# Patient Record
Sex: Female | Born: 1955 | Race: Black or African American | Hispanic: No | Marital: Single | Smoking: Former smoker
Health system: Southern US, Community
[De-identification: ages and names within clinical notes are randomized; demographics above are authoritative.]

## PROBLEM LIST (undated history)

## (undated) DIAGNOSIS — I10 Essential (primary) hypertension: Secondary | ICD-10-CM

## (undated) DIAGNOSIS — I499 Cardiac arrhythmia, unspecified: Secondary | ICD-10-CM

## (undated) DIAGNOSIS — E119 Type 2 diabetes mellitus without complications: Secondary | ICD-10-CM

## (undated) DIAGNOSIS — K219 Gastro-esophageal reflux disease without esophagitis: Secondary | ICD-10-CM

## (undated) DIAGNOSIS — J3800 Paralysis of vocal cords and larynx, unspecified: Secondary | ICD-10-CM

## (undated) DIAGNOSIS — G4730 Sleep apnea, unspecified: Secondary | ICD-10-CM

## (undated) DIAGNOSIS — E039 Hypothyroidism, unspecified: Secondary | ICD-10-CM

## (undated) DIAGNOSIS — I5030 Unspecified diastolic (congestive) heart failure: Secondary | ICD-10-CM

## (undated) HISTORY — PX: CHOLECYSTECTOMY: SHX55

## (undated) HISTORY — PX: THYROIDECTOMY: SHX17

## (undated) HISTORY — PX: ABDOMINAL HYSTERECTOMY: SHX81

---

## 1997-08-23 ENCOUNTER — Emergency Department (HOSPITAL_COMMUNITY): Admission: EM | Admit: 1997-08-23 | Discharge: 1997-08-23 | Payer: Self-pay | Admitting: Emergency Medicine

## 1997-10-18 ENCOUNTER — Observation Stay (HOSPITAL_COMMUNITY): Admission: RE | Admit: 1997-10-18 | Discharge: 1997-10-19 | Payer: Self-pay | Admitting: Obstetrics and Gynecology

## 1997-10-18 ENCOUNTER — Encounter: Payer: Self-pay | Admitting: Obstetrics and Gynecology

## 1999-06-23 ENCOUNTER — Ambulatory Visit (HOSPITAL_COMMUNITY): Admission: RE | Admit: 1999-06-23 | Discharge: 1999-06-23 | Payer: Self-pay | Admitting: Obstetrics and Gynecology

## 1999-06-23 ENCOUNTER — Encounter (INDEPENDENT_AMBULATORY_CARE_PROVIDER_SITE_OTHER): Payer: Self-pay | Admitting: Specialist

## 2001-07-19 ENCOUNTER — Other Ambulatory Visit: Admission: RE | Admit: 2001-07-19 | Discharge: 2001-07-19 | Payer: Self-pay | Admitting: Obstetrics and Gynecology

## 2002-03-14 ENCOUNTER — Encounter: Payer: Self-pay | Admitting: *Deleted

## 2002-03-14 ENCOUNTER — Ambulatory Visit (HOSPITAL_COMMUNITY): Admission: RE | Admit: 2002-03-14 | Discharge: 2002-03-14 | Payer: Self-pay | Admitting: *Deleted

## 2002-03-31 ENCOUNTER — Ambulatory Visit (HOSPITAL_COMMUNITY): Admission: RE | Admit: 2002-03-31 | Discharge: 2002-03-31 | Payer: Self-pay | Admitting: Family Medicine

## 2002-03-31 ENCOUNTER — Encounter: Payer: Self-pay | Admitting: Family Medicine

## 2002-07-25 ENCOUNTER — Other Ambulatory Visit: Admission: RE | Admit: 2002-07-25 | Discharge: 2002-07-25 | Payer: Self-pay | Admitting: Obstetrics and Gynecology

## 2002-07-27 ENCOUNTER — Ambulatory Visit (HOSPITAL_COMMUNITY): Admission: RE | Admit: 2002-07-27 | Discharge: 2002-07-27 | Payer: Self-pay | Admitting: Obstetrics and Gynecology

## 2002-07-27 ENCOUNTER — Encounter: Payer: Self-pay | Admitting: Obstetrics and Gynecology

## 2002-12-19 ENCOUNTER — Inpatient Hospital Stay (HOSPITAL_COMMUNITY): Admission: RE | Admit: 2002-12-19 | Discharge: 2002-12-21 | Payer: Self-pay | Admitting: Obstetrics and Gynecology

## 2002-12-19 ENCOUNTER — Encounter (INDEPENDENT_AMBULATORY_CARE_PROVIDER_SITE_OTHER): Payer: Self-pay | Admitting: Specialist

## 2003-03-06 ENCOUNTER — Inpatient Hospital Stay (HOSPITAL_COMMUNITY): Admission: EM | Admit: 2003-03-06 | Discharge: 2003-03-07 | Payer: Self-pay | Admitting: Emergency Medicine

## 2003-05-04 ENCOUNTER — Ambulatory Visit (HOSPITAL_BASED_OUTPATIENT_CLINIC_OR_DEPARTMENT_OTHER): Admission: RE | Admit: 2003-05-04 | Discharge: 2003-05-04 | Payer: Self-pay | Admitting: Family Medicine

## 2004-07-29 ENCOUNTER — Encounter (HOSPITAL_COMMUNITY): Admission: RE | Admit: 2004-07-29 | Discharge: 2004-10-14 | Payer: Self-pay | Admitting: Family Medicine

## 2004-08-20 ENCOUNTER — Encounter (INDEPENDENT_AMBULATORY_CARE_PROVIDER_SITE_OTHER): Payer: Self-pay | Admitting: *Deleted

## 2004-08-20 ENCOUNTER — Other Ambulatory Visit: Admission: RE | Admit: 2004-08-20 | Discharge: 2004-08-20 | Payer: Self-pay | Admitting: Interventional Radiology

## 2004-08-20 ENCOUNTER — Encounter: Admission: RE | Admit: 2004-08-20 | Discharge: 2004-08-20 | Payer: Self-pay | Admitting: Family Medicine

## 2006-05-05 ENCOUNTER — Ambulatory Visit: Payer: Self-pay | Admitting: Internal Medicine

## 2006-06-02 ENCOUNTER — Ambulatory Visit: Payer: Self-pay | Admitting: Internal Medicine

## 2006-12-03 ENCOUNTER — Ambulatory Visit: Payer: Self-pay | Admitting: Cardiovascular Disease

## 2006-12-03 ENCOUNTER — Ambulatory Visit (HOSPITAL_COMMUNITY): Admission: RE | Admit: 2006-12-03 | Discharge: 2006-12-03 | Payer: Self-pay | Admitting: Cardiology

## 2007-06-24 ENCOUNTER — Ambulatory Visit (HOSPITAL_COMMUNITY): Admission: RE | Admit: 2007-06-24 | Discharge: 2007-06-24 | Payer: Self-pay | Admitting: Cardiology

## 2010-04-07 ENCOUNTER — Other Ambulatory Visit: Payer: Self-pay | Admitting: Family Medicine

## 2010-04-07 DIAGNOSIS — M545 Low back pain: Secondary | ICD-10-CM

## 2010-04-11 ENCOUNTER — Ambulatory Visit
Admission: RE | Admit: 2010-04-11 | Discharge: 2010-04-11 | Disposition: A | Discharge status: Home / Self Care | Payer: 59 | Source: Ambulatory Visit | Attending: Family Medicine | Admitting: Family Medicine

## 2010-04-11 DIAGNOSIS — M545 Low back pain: Secondary | ICD-10-CM

## 2010-06-06 NOTE — Op Note (Signed)
[facility]  Patient:    Tammy Mckinney, Tammy Mckinney                       MRN: [id number] Proc. Date: 06/23/99 Adm. Date:  [id number] Attending:  Osborn Coho                           Operative Report  PREOPERATIVE DIAGNOSIS:       Menorrhagia.  POSTOPERATIVE DIAGNOSIS:      Menorrhagia.  OPERATION:                    Hysteroscopy.  Dilatation and curettage.  SURGEON:                      Mark E. Dareen Piano, M.D.  ASSISTANT:  ANESTHESIA:                   MAC with paracervical block.  DRAINS:                       None.  ANTIBIOTICS:                  Ancef 1 gram.  ESTIMATED BLOOD LOSS:         Minimal.  COMPLICATIONS:                None.  FINDINGS:                     The patient had a normal appearing endocervical canal. The endometrial cavity had no evidence of lesions, polyps, or submucosal  fibroids.  Ostia were both seen and appeared to be normal.  DESCRIPTION OF PROCEDURE:     The patient was taken to the operating room where she was placed in the dorsal lithotomy position.  She was prepped with Hibiclens and draped in the usual fashion for this procedure.  A sterile speculum was placed n the vagina.  20 cc of 1% lidocaine was used for paracervical block.  A single tooth tenaculum was applied to the anterior cervical lip.  The cervical canal was then dilated to a 29 [place].  The hysteroscope was passed through the endocervical canal which appeared to be normal.  On entering the endometrial cavity, both ostia were visualized.  There was no evidence of any endometrial lesions, submucosal fibroids, or polyps.  At this point, the hysteroscope was removed, endocervical curettage was performed.  This was followed by an endometrial curettage.  The patient was then taken to the recovery room in stable condition.  Sponge, needle, and instrument  counts were correct x 2.  The patient was discharged to home on Anaprox double strength  p.r.n.  She will follow up in the office in four weeks. DD:  06/23/99 TD:  06/25/99 Job: [id number]

## 2010-06-06 NOTE — H&P (Signed)
NAME:  Tammy Mckinney, Tammy Mckinney                          ACCOUNT NO.:  [id number]   MEDICAL RECORD NO.:  [id number]                   PATIENT TYPE:  INP   LOCATION:  NA                                   FACILITY:  [facility]   PHYSICIAN:  Malva Limes, M.D.                 DATE OF BIRTH:  [birth year]   DATE OF ADMISSION:  DATE OF DISCHARGE:                                HISTORY & PHYSICAL   HISTORY OF PRESENT ILLNESS:  Ms. Tammy Mckinney is a 55 year old black female G2 P1-0-  1-1 who presents today for a total abdominal hysterectomy and bilateral  salpingo-oophorectomy secondary to a 10- to 12-year history of  menometrorrhagia, uterine fibroids, and recent onset of right lower quadrant  pain.  The patient has had two D&Cs in the past which gave the patient some  relief; however, the symptoms have returned.  The patient was offered  cryoablation and declined.  The patient has also had a CT scan which reveals  a small ovarian cyst and small fibroids; otherwise, no pathology is  identified.  Of significant is the patient has cardiomegaly which is mild  and has been thoroughly worked up by her cardiologist.  Anesthesia is aware  of this.  The patient also will have bilateral salpingo-oophorectomy  secondary to her age and extensive discussion was undertaken with the  patient in regard to hormone replacement therapy after the surgery.  The  patient elects to follow her symptoms and only use estrogen if needed.   PAST MEDICAL HISTORY:  The patient has no known drug allergies.  The patient  smokes one-half pack per day, drinks alcohol occasionally, and denies drug  use.   CURRENT MEDICATIONS:  1. Atenolol 25 mg.  2. Verapamil 50 mg.  3. Advicor 20/500.  4. Spironolactone.   PAST SURGERIES:  1. Spontaneous vaginal delivery.  2. Bilateral tubal ligation.  3. Cholecystectomy.   PHYSICAL EXAMINATION:  GENERAL:  The patient is a well-developed, well-  nourished black female who is overweight.  VITAL SIGNS:   Her weight is 214 pounds.  Her blood pressure is 120/70.  HEENT:  Within normal limits.  LUNGS:  Clear to auscultation.  CARDIOVASCULAR:  Reveals a regular rate and rhythm with a 2/6 systolic  ejection murmur.  BREASTS:  Without masses or tenderness.  There is no lymphadenopathy.  ABDOMEN:  Soft, nontender, nondistended.  There is no organomegaly.  There  is no rebound or guarding.  EXTREMITIES:  Within normal limits.  PELVIC:  Reveals normal external genitalia.  The vagina is without lesions  or discharge.  The cervix is parous, the uterus somewhat difficult to feel  but appears to be nine weeks in size.  There are no adnexal masses palpated.  RECTAL:  Within normal limits.  EXTREMITIES:  Within normal limits.  SKIN:  Within normal limits.   IMPRESSION:  1. Menometrorrhagia.  2. Uterine fibroid.  3. Right  lower quadrant pain.   PLAN:  Proceed with total abdominal hysterectomy and bilateral salpingo-  oophorectomy.                                               Malva Limes, M.D.    MA/MEDQ  D:  12/19/2002  T:  12/19/2002  Job:  [id number]

## 2010-06-06 NOTE — Assessment & Plan Note (Signed)
[facility]                             PULMONARY OFFICE NOTE   Tammy Mckinney, Tammy Mckinney                       MRN:          [id number]  DATE:05/05/2006                            DOB:          May 02, 1955    CHIEF COMPLAINT:  Dyspnea.   HISTORY:  A 55 year old, black female with about a 40 pound weight gain  since she stopped smoking in 2006 complaining of worsening dyspnea after  she quite smoking to the point where she has difficulty going up any  inclines. She says she does okay on a flat surface and even can walk at  the mall but struggles whenever she is going uphill or doing any  exertion. She does notice mild associated hoarseness and heartburn but  no significant cough, significant sputum production purulent or  otherwise, fevers, chills, sweats, orthopnea, PND, chest pain or leg  swelling.   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 In retrospect, this correlates 100% to the onset  of her dyspnea.   PAST MEDICAL HISTORY:  Significant for obesity, hypertension, sleep  apnea. She is status post hysterectomy and cholecystectomy.   ALLERGIES:  None known.   MEDICATIONS:  She takes Nexium and Zantac both in the morning and wears  CPAP at bedtime.   SOCIAL HISTORY:  She quit smoking in 2006 and works in Insurance account manager.   FAMILY HISTORY:  Positive for asthma in mother and sister. Allergies in  mother and sister.   REVIEW OF SYSTEMS:  Taken in detail on the worksheet and negative except  as outlined above.   PHYSICAL EXAMINATION:  GENERAL:  This is a pleasant, ambulatory, black  female who is extremely hoarse in no acute distress.  VITAL SIGNS:  She had stable vital signs. Weight of 242 pounds.  HEENT:  Unremarkable. Pharynx clear. Dentition intact.  NECK:  Supple without cervical adenopathy or tenderness. The nasal  turbinate is normal. Ear canals are clear.  LUNGS:  Lung fields were completely clear bilaterally to auscultation  and percussion except for a permanent pseudowheeze.  HEART:  She has a regular rate and rhythm without murmur, gallop or rub.  ABDOMEN:  Obese, soft and benign.  EXTREMITIES:  Warm without calf tenderness, cyanosis, clubbing or edema.   Chest x-ray revealed mild cardiomegaly but otherwise no infiltrates or  effusions.   Hemoglobin saturation 93% on room air.   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 This is not  likely to be correctable with any further surgical intervention. The  best she can do at this point is work toward reconditioning, weight loss  and control reflux, which is probably the only potentially reversible  cause of upper airway obstruction/vocal cord dysfunction.   RECOMMENDATIONS:  Continue to take Nexium 30-60 minutes before first  meal, Zantac 150 mg q.h.s., Tylenol  PM one at bedtime because of her  sensation of throat drainage and also to help her sleep. A diet was  reviewed with her in detail regarding the diagnosis of GERD and how to  manage this optimally.   Followup will be in 4-6 weeks with a set of PFTs. This should be able to  tell us to what extent she has upper airway versus lower airway  obstruction.     Charlaine Dalton. Sherene Sires, MD, [facility]  Electronically Signed    MBW/MedQ  DD: 05/05/2006  DT: 05/06/2006  Job #: [id number]   cc:   Molly Maduro A. Nicholos Johns, M.D.

## 2010-06-06 NOTE — Discharge Summary (Signed)
NAME:  Tammy Mckinney, Tammy Mckinney                          ACCOUNT NO.:  [id number]   MEDICAL RECORD NO.:  [id number]                   PATIENT TYPE:  INP   LOCATION:  9322                                 FACILITY:  [facility]   PHYSICIAN:  Malva Limes, M.D.                 DATE OF BIRTH:  1955/07/07   DATE OF ADMISSION:  12/19/2002  DATE OF DISCHARGE:  12/21/2002                                 DISCHARGE SUMMARY   PRINCIPAL DISCHARGE DIAGNOSES:  1. Menomenorrhagia.  2. Chronic pelvic pain.  3. Uterine fibroids.   PRINCIPAL PROCEDURE:  Total abdominal hysterectomy with bilateral salpingo-  oophorectomy.   HISTORY OF PRESENT ILLNESS:  Tammy Mckinney is a 55 year old black female G2, P1-0-  1-1 who presented to [facility] on December 19, 2002, for total  abdominal hysterectomy with bilateral salpingo-oophorectomy.  A complete  description of the events which lead up to this admission can be found in  dictated history and physical.  The patient underwent a total abdominal  hysterectomy with bilateral salpingo-oophorectomy without complications.  Description of this procedure is located in the dictated operative note.   The patient's postoperative course was uncomplicated.  The patient's  preoperative hemoglobin was 15.3, postoperative was 12.6.  The patient's  pathology was benign.  The patient was discharged to home on postoperative  day #2.  She was instructed to follow up in the office in four weeks.  She  was sent home with Tylox to take p.r.n.  At the time of discharge she was  ambulating without difficulty, eating a regular diet and her incision  appeared to be healing well.                                               Malva Limes, M.D.    MA/MEDQ  D:  01/22/2003  T:  01/22/2003  Job:  [id number]

## 2010-06-06 NOTE — Cardiovascular Report (Signed)
NAME:  Tammy Mckinney, Tammy Mckinney                          ACCOUNT NO.:  [id number]   MEDICAL RECORD NO.:  [id number]                   PATIENT TYPE:  OIB   LOCATION:  3733                                 FACILITY:  [facility]   PHYSICIAN:  Francisca December, M.D.               DATE OF BIRTH:  11-Dec-1955   DATE OF PROCEDURE:  03/06/2003  DATE OF DISCHARGE:                              CARDIAC CATHETERIZATION   PROCEDURES PERFORMED:  1. Left heart catheterization.  2. Coronary angiography.  3. Left ventriculogram.   CARDIOLOGIST:  Francisca December, M.D.   INDICATIONS:  Tammy Mckinney is a 55 year old woman with known HOCM.  She was  admitted yesterday at [facility] after prolonged tachy palpitation  and associated chest pain.  CK and MB enzymes have been elevated.  She is  brought to the cardiac catheterization laboratory at this time to identify  possible CAD as an etiology.   PROCEDURAL NOTE:  The patient was brought to the cardiac catheterization  laboratory in the fasting state.  The right groin was prepped and draped in  the usual sterile fashion.  Local anesthesia was obtained with the  infiltration of 1% lidocaine.  A 6 French catheter and sheath was inserted  percutaneously into the right femoral artery utilizing an anterior approach  over a guiding J-wire.  A 110 cm pigtail catheter was used to measure  pressures in the ascending aorta and in the left ventricle both prior to and  following the ventriculogram.  A 30-degree cine left ventriculogram was  performed utilizing a power injector.  The pigtail catheter was then  exchanged for a 6 [place] #4 left Judkins catheter and cine angiography of  the left coronary artery was conducted in multiple LAO and RAO projections.  The 6 French #4 left Judkins catheter was exchanged for a 6 [place] #4 right  Judkins catheter.  Cine angiography of the right coronary artery was  conducted in LAO and RAO projections.  All catheter manipulations  were  performed using fluoroscopic observation and exchanged performed over a long  guiding J-wire.   At the completion of the procedure the catheter and catheter sheaths were  removed.  Hemostasis was achieved by direct pressure.   The patient was transported to the recovery area in stable condition with an  intact distal pulse   HEMODYNAMIC DATA:  Systemic arterial pressure was 106/71 with a mean of 88  mmHg.  There was no systolic gradient across the aortic valve.  The left  ventricular end diastolic pressure was 35 mmHg pre- ventriculogram.   ANGIOGRAPHIC DATA:  Left Ventriculogram:  The left ventriculogram was a poor  quality study due to the inducement of four to five beats of ventricular  tachycardia secondary to catheter and dye jet irritation.  There was clear  significant obstruction in the midportion of the LV with obliteration of the  cavity.  This primarily occurred  during the run of  VT.  Three beats of  poorly opacified LV function were obtained at the end of the left  ventriculogram.  During this there was no complete obstruction.  There was  anterolateral akinesis seen.  There was also very mild apical focal  dyskinesis.  There was mitral regurgitation due to th ventricular  tachycardia.  No coronary calcification was seen.   There was a right dominant coronary system present.   The main left coronary artery was normal.   Left left anterior descending artery and its branches was widely patent.  This is a large vessel, which is transapical and perfuses the distal one-  third to one-half of the inferior septum from the apex to the mid.  There  are two diagonal branches; the first of which is small-to-moderate in size  and the second of which is small.  There are no obstructions or luminal  irregularities seen within any portion of the anterior descending artery.   The left circumflex coronary artery in a similar fashion is widely patent.  There is a moderate-sized  first marginal branch and a small second marginal  branch.  The third marginal branch is tiny and the fourth is large and is  the dominant vessel in the obtuse margin of the heart.  There are no  obstructions whatsoever in the left circumflex coronary artery or its  branches.   The right coronary artery and its branches is again free of disease.  There  is a small posterior descending artery and a small posterolateral segment  with one small left ventricular branch.  There are no obstructions or  luminal irregularities seen in the proximal, mid or branch portions of the  right coronary.   Collateral vessels are not seen.   FINAL IMPRESSION:  1. Mid cavitary obstruction hypertrophic cardiomyopathy.  2. Regional wall motion abnormality is noted compatible with recent non-Q     wave myocardial infarction.  3. Widely patent coronary artery.  4. Markedly elevated left ventricular end diastolic pressure.   PLANS AND RECOMMENDATIONS:  1. I think I will place this patient on Plavix for at least six to nine     months in addition to a baby aspirin.  2. The patient will be reinitiated on her usual medications, which include     verapamil and atenolol as well as spironolactone.   The etiology of her non-Q wave MI is unclear.  It did occur in the setting  of atrial fibrillation with rapid ventricular response.  This may be the  inducing factor or the result of her MI.  Coronary vasospasm or coronary  embolus can be implicated, it may also be simply severe endocardial ischemia  in the setting of hypertrophic cardiomyopathy and atrial fibrillation with  rapid ventricular response.                                               Francisca December, M.D.    JHE/MEDQ  D:  03/06/2003  T:  03/07/2003  Job:  [id number]

## 2010-06-06 NOTE — Op Note (Signed)
NAME:  Tammy Mckinney, Tammy Mckinney                          ACCOUNT NO.:  [id number]   MEDICAL RECORD NO.:  [id number]                   PATIENT TYPE:  INP   LOCATION:  9322                                 FACILITY:  [facility]   PHYSICIAN:  Malva Limes, M.D.                 DATE OF BIRTH:  [birth year]   DATE OF PROCEDURE:  12/19/2002  DATE OF DISCHARGE:                                 OPERATIVE REPORT   PREOPERATIVE DIAGNOSES:  1. Menomenorrhagia.  2. Pelvic pain.  3. Uterine fibroid.   POSTOPERATIVE DIAGNOSES:  1. Menomenorrhagia.  2. Pelvic pain.  3. Uterine fibroid.   PROCEDURE:  Total abdominal hysterectomy with bilateral salpingo-  oophorectomy.   SURGEON:  Malva Limes, M.D.   ASSISTANT:  Luvenia Redden, M.D.   ANESTHESIA:  General endotracheal anesthesia.   ANTIBIOTICS:  Ancef 1 g.   DRAINS:  Foley to bedside drainage.   ESTIMATED BLOOD LOSS:  150 mL.   COMPLICATIONS:  None.   SPECIMENS:  Cervix, uterus, fallopian tubes and ovaries sent to pathology.   FINDINGS:  The patient appeared to have no evidence of any endometriosis or  pelvic adhesions.  The patient had evidence of past tubal ligation.  Ovaries  were normal bilaterally.  Appendix appeared to be normal.  There was no  evidence of any lymphadenopathy or abdominal adhesions.  The gallbladder was  surgically absent.  The liver appeared to be normal.  Kidneys were normal  bilaterally.   DESCRIPTION OF PROCEDURE:  The patient was taken to the operating room where  general anesthetic was administered.  She was placed in the dorsal supine  position.  She was prepped and draped in the usual fashion.  A Foley  catheter was placed.  PAS stockings had been placed prior to beginning the  surgery.  The patient had a Pfannenstiel incision made.  This was carried  down to the fascia.  The fascia was entered in the midline and extended  laterally with the Mayo scissors.  The rectus muscles were divided from the  fascia with the  Bovie.  Rectus muscles were divided in the midline, taking  superiorly and inferiorly.  Parietal peritoneum was entered sharply and  taken superiorly and inferiorly.  The patient then had examination under  anesthesia with findings as noted above.  At this point, the Lenox Ahr retractor was placed and the bowel was packed away with three  laps.  The right round ligament was then ligated with 0 Monocryl suture,  transected with the Bovie.  The anterior and posterior leaf of the broad  ligament was then opened.  The ureter was identified.  The bladder flap was  taken down sharply.  At this point, the infundibulopelvic ligament on the  left was isolated, clamped, cut and ligated x2 with 0 Monocryl suture.  Uterine vessels were skeletonized on the left, clamped, cut and ligated with  0  Monocryl suture.  Similar procedure was performed on the opposite side.  The cardinal ligaments were then serially clamped, cut and ligated with 0  Monocryl suture.  Once the level of the external os was reached, the vagina  was entered and circumscribed.  The cervix, fallopian tubes, uterus and  ovaries were then sent to pathology.  Vaginal angles were then closed using  0 Monocryl suture in Heaney fashion.  The remaining vaginal cuff was closed  using 0 Monocryl suture in interrupted figure-of-eight fashion.  The pelvis  was then copiously irrigated.  Small perineal bleeders were made hemostatic  with the Bovie.  At this point, the appendix was examined and appeared to be  normal.  No etiology of the patient's right lower quadrant and lower  extremity pain could be identified.  It is felt this is most likely to  potential problems in her sacroiliac or lumbar region.  At this point, the  O'Connor-O'Sullivan retractor was removed.  The bowel was inspected and  appeared to be normal.  Laps were removed.  The parietal peritoneum and  rectus muscles were reapproximated in the midline using 2-0 Monocryl  suture  in a running fashion.  Fascia was closed using 0 Monocryl suture in running  fashion.  Subcuticular tissue was made hemostatic with the Bovie and then  closed using 2-0 plain gut suture in interrupted fashion.  Stainless steel  clips were used to close the skin.  The patient tolerated the procedure  well.  She was taken to the recovery room in stable condition.  Instrument  and lap counts were correct x2.                                               Malva Limes, M.D.    MA/MEDQ  D:  12/19/2002  T:  12/19/2002  Job:  [id number]

## 2010-06-06 NOTE — H&P (Signed)
NAME:  Tammy Mckinney, Tammy Mckinney                          ACCOUNT NO.:  [id number]   MEDICAL RECORD NO.:  [id number]                   PATIENT TYPE:  INP   LOCATION:  0109                                 FACILITY:  [facility]   PHYSICIAN:  Quita Skye. Waldon Reining, MD             DATE OF BIRTH:  17-Jul-1955   DATE OF ADMISSION:  03/06/2003  DATE OF DISCHARGE:                                HISTORY & PHYSICAL   HISTORY OF PRESENT ILLNESS:  Tammy Mckinney is a 55 year old black woman who  was admitted to [facility] following an episode of atrial  fibrillation with a rapid ventricular rate, associated with chest pain and  positive cardiac enzymes.   The patient has a life-long history of cardiac murmur.  She sees Dr. Amil Amen  on a periodic basis for followup.  She does not have a history of coronary  artery disease; she reports undergoing cardiac catheterization in the remote  past, which did not demonstrate coronary artery disease.  She has  experienced episodes of a rapid heartbeat in the past __________ atenolol  and verapamil.   The patient experienced the sudden onset of a rapid and irregular heartbeat  this evening while watching television.  This was associated with chest  pain, nausea, and diaphoresis.  There was no dyspnea, dizziness, light-  headedness, syncope, or near syncope.  The chest pain was described as a  substernal pressure.  It does not radiate.  There were no exacerbating or  ameliorating factors.  It appeared to be unrelated to position, activity,  meals, and respirations.  The chest pain and rapid, irregular heartbeat  eased abruptly while she was in the emergency department.  This corresponded  to her conversion to a normal sinus rhythm.  The total duration of symptoms  was perhaps two hours.  The patient is asymptomatic at this time.   There is no history of congestive heart failure.  There is no history of  hypertension, hyperlipidemia, diabetes mellitus, smoking, or  family history  of early coronary artery disease.   The patient has no other medical problems.   CURRENT MEDICATIONS:  Include verapamil and atenolol as described above, as  well as spironolactone.   PREVIOUS OPERATIONS:  Hysterectomy in November 2004.   SOCIAL HISTORY:  The patient lives with her boyfriend and her daughter.  She  works in a Primary school teacher.  She does not drink alcohol or smoke.   FAMILY HISTORY:  Noncontributory.   REVIEW OF SYSTEMS:  Reveals no problems related to her head, eyes, ears,  nose, mouth, throat, lungs, gastrointestinal, genitourinary system, or  extremities.  There is no history of neurologic or psychiatric disorder.  There is no history of fever, chills, or weight loss.   The patient reports a milder and briefer episode of a similar rapid and  irregular heartbeat associated with chest pains two days ago.  It lasted  approximately 30  minutes and resolved spontaneously.   PHYSICAL EXAMINATION:  VITAL SIGNS:  Blood pressure 118/75, pulse 85 and  regular, respirations 18, temperature 96.5.  GENERAL:  The patient was a middle-aged black woman in no discomfort.  She  was alert, oriented, appropriate, and responsive.  HEAD, EYES, NOSE, AND MOUTH:  Normal.  NECK:  Without thyromegaly or adenopathy.  Carotid pulses were palpable  bilaterally without bruits.  CARDIAC EXAMINATION:  Revealed a normal S1 and S2.  There was no S3, S4,  rub, or click.  There was a grade 2/6 pansystolic murmur heard at the apex;  it radiated to the left sternal border.  No chest wall tenderness was noted.  LUNGS:  Clear.  ABDOMEN:  Soft and nontender.  There was no mass, hepatosplenomegaly, bruit,  distention, rebound, guarding, or rigidity.  Bowel sounds were normal.  PELVIC/RECTAL/BREAST EXAMINATIONS:  Were not performed as they were not  pertinent to the reason for acute care hospitalization.  EXTREMITIES: Without edema, deviation, or deformity.  Radial and  dorsalis  pedis pulses were palpable bilaterally.  NEUROLOGIC:  The screening neurologic survey was unremarkable.   LABORATORY DATA:  The initial electrocardiogram demonstrated atrial  fibrillation with a ventricular rate of 119 beats per minute.  The  postconversion electrocardiogram revealed normal sinus rhythm with biatrial  enlargement, left axis deviation, and left ventricular hypertrophy with  secondary QRS widening and repolarization abnormalities.  Chest radiographs,  according to the emergency department physician, was unremarkable.  Initial  CK was 192 with a CK-MB of 18, relative index of 9.4, and troponin 0.13.  Potassium was 4.1 with a BUN of 11 and creatinine of 0.6.  The remaining  studies are pending at the time of this dictation.   IMPRESSION:  1. Atrial fibrillation with rapid ventricular rate, resolved, with chest     pain and positive cardiac enzymes.  2. Mitral regurgitation.   PLAN:  1. Telemetry.  2. Serial cardiac enzymes.  3. Aspirin.  4. Heparin.  5. Intravenous nitroglycerin.  6. Metoprolol.  7. Echocardiogram.  8. Thyroid function tests.  9. Fasting lipid profile.  10.      Further measures per Dr. Amil Amen.                                               Quita Skye. Waldon Reining, MD    MSC/MEDQ  D:  03/06/2003  T:  03/06/2003  Job:  [id number]   cc:   Francisca December, M.D.  [address]  [address]  [place]  [place] [address]  Fax: [phone]

## 2010-06-06 NOTE — Discharge Summary (Signed)
NAME:  Tammy Mckinney, Tammy Mckinney                          ACCOUNT NO.:  [id number]   MEDICAL RECORD NO.:  [id number]                   PATIENT TYPE:  INP   LOCATION:  3733                                 FACILITY:  [facility]   PHYSICIAN:  Francisca December, M.D.               DATE OF BIRTH:  09-May-1955   DATE OF ADMISSION:  03/06/2003  DATE OF DISCHARGE:  03/07/2003                                 DISCHARGE SUMMARY   CHIEF COMPLAINT AND REASON FOR ADMISSION:  Ms. Esch is a 55 year old female  patient of Dr. Amil Amen' who has had a lifelong history of cardiac murmur, no  known history of CAD.  She has had known episodes of rapid heartbeat that  have been treated in the past with atenolol and verapamil.  On the date of  admission she experienced sudden onset of rapid and irregular heartbeat  while watching television.  This was associated with chest pain, nausea, and  diaphoresis.  No other associated symptoms.  She described the chest pain as  being substernal in nature without any radiation.  After arrival to the ER  the patient was noted to be in atrial fibrillation with rapid ventricular  rate and subsequent cardiac isoenzymes were positive.  EKG was within normal  limits without ischemic changes.  Her rapid ventricular rate resolved  spontaneously without treatment in the ER.  Her vital signs otherwise were  stable initially.  Her physical exam in the ER was unremarkable except for a  grade 2/6 pansystolic murmur at the apex, left sternal border.  Again, the  EKG showed no ischemic changes but did show atrial fibrillation with  ventricular rate at the time of performance of 119 beats per minute.  EKG  done after she converted to sinus rhythm showed biatrial enlargement, left  axis deviation, LVH.  Initial troponin was 0.13 with a CK 192, relative  index of 9.4.  BUN and creatinine were normal.   This patient was admitted with the following diagnoses:  1. Atrial fibrillation with rapid ventricular  rate, resolved.  2. Chest pain with positive cardiac enzymes, probable non-Q-wave myocardial     infarction.  3. Known mitral regurgitation.   HOSPITAL COURSE:  CHEST PAIN.  The patient was admitted to the general  telemetry unit where routine cardiac isoenzymes were monitored as well as a  heparin drip was initiated.  Due to the patient's low blood pressure  nitroglycerin was held. Troponin peaked at 0.41 with CK of 198, relative  index of 11.2.  She remained in sinus rhythm and after talking with the  patient and examining her Dr. Amil Amen determined that cardiac  catheterization would be appropriate.  The patient was subsequently  transferred via [facility] where she underwent cardiac  catheterization on March 06, 2003.  Findings showed an LV EF of 40-45%,  anterolateral akinesis, no significant CAD.  Unclear as to why the  patient  experienced non-Q MI, questionable embolism versus spasm.  In the post  catheterization period the patient did well.  She did not have any  difficulty with hematoma formation and she was resumed on her previous  cardiac medications including beta blocker and verapamil.  Due to the fact  that she had an MI she was also started on aspirin and Plavix and this was  continued at discharge.  A lipid panel was performed demonstrating a  cholesterol of 209, HDL cholesterol 34, triglycerides 134, and LDL  cholesterol 148.  She has subsequently been started on a statin prior to  discharge.  A 2-D echocardiogram was also performed during the  hospitalization and at the present time I am unable to locate a report of  this on the chart.  Again, this patient has known hypertrophic obstructive  cardiomyopathy and a LV EF per catheterization was noted to be between 40%  and 45%.  Important to note this patient has not experienced any additional  EKG changes and has remained in sinus rhythm since initially converting in  the ER.   FINAL DIAGNOSES:   1. Status post non-Q-wave myocardial infarction with normal coronaries per     cardiac catheterization on March 06, 2003.  2. Known hypertrophic obstructive cardiomyopathy with a decreased left     ventricular systolic function, ejection fraction 40-45%.  3. New diagnosis of dyslipidemia with mildly-elevated cholesterol and     significantly elevated LDL cholesterol.  4. Recent atrial fibrillation with rapid ventricular rate with spontaneous     conversion to sinus rhythm while in emergency room.  5. Tobacco abuse with smoking cessation consult this hospitalization.   DISCHARGE MEDICATIONS:  1. Atenolol at previous dosage.  2. Verapamil SR 240 mg daily.  3. Spironolactone 25 mg daily.  4. Crestor 10 mg daily.  The patient was instructed to stop by the office     and pick up 6 weeks worth of samples.  5. Plavix 75 mg daily for 1 year.  6. Aspirin 325 mg daily.   ACTIVITY:  Out of work for 2 weeks.   WOUND CARE:  Not applicable.   Activity as per cardiac rehab's recommendations.   FOLLOW-UP APPOINTMENTS:  1. She is to follow up at the lipid clinic at [facility] with the     Pharm.D. on Thursday, April 12, 2003 at 9:30 a.m.  She has been     instructed to have nothing to eat or drink after midnight Wednesday,     April 11, 2003.  2. She also is to follow up with Dr. Amil Amen on Monday, April 02, 2003 at     11:20 a.m. for routine hospital follow-up.     Allison L. Rolene Course                    Francisca December, M.D.    ALE/MEDQ  D:  03/29/2003  T:  03/30/2003  Job:  [id number]

## 2012-10-20 ENCOUNTER — Other Ambulatory Visit: Payer: Self-pay | Admitting: Cardiology

## 2012-10-24 ENCOUNTER — Other Ambulatory Visit: Payer: Self-pay | Admitting: Cardiology

## 2012-10-24 MED ORDER — AMIODARONE HCL 200 MG PO TABS: 100.0000 mg | ORAL_TABLET | Freq: Every day | ORAL | Status: DC

## 2012-10-25 ENCOUNTER — Telehealth: Payer: Self-pay | Admitting: Cardiology

## 2012-10-25 NOTE — Telephone Encounter (Signed)
New message  Need refill on amioderone and warfarin. [facility] [place] [phone]. Pt states she is out of amioderone.  Last took amioderone was last fri.

## 2012-10-26 ENCOUNTER — Other Ambulatory Visit: Payer: Self-pay | Admitting: Obstetrics and Gynecology

## 2012-11-03 ENCOUNTER — Other Ambulatory Visit: Payer: Self-pay | Admitting: Obstetrics and Gynecology

## 2012-11-03 DIAGNOSIS — R928 Other abnormal and inconclusive findings on diagnostic imaging of breast: Secondary | ICD-10-CM

## 2012-11-07 ENCOUNTER — Other Ambulatory Visit: Payer: Self-pay | Admitting: *Deleted

## 2012-11-07 DIAGNOSIS — I2510 Atherosclerotic heart disease of native coronary artery without angina pectoris: Secondary | ICD-10-CM

## 2012-11-07 DIAGNOSIS — Z79899 Other long term (current) drug therapy: Secondary | ICD-10-CM

## 2012-11-09 ENCOUNTER — Ambulatory Visit (INDEPENDENT_AMBULATORY_CARE_PROVIDER_SITE_OTHER): Payer: 59 | Admitting: Pharmacist

## 2012-11-09 DIAGNOSIS — I4891 Unspecified atrial fibrillation: Secondary | ICD-10-CM

## 2012-11-09 LAB — POCT INR: INR: 1.2

## 2012-11-17 ENCOUNTER — Ambulatory Visit
Admission: RE | Admit: 2012-11-17 | Discharge: 2012-11-17 | Disposition: A | Discharge status: Home / Self Care | Payer: 59 | Source: Ambulatory Visit | Attending: Obstetrics and Gynecology | Admitting: Obstetrics and Gynecology

## 2012-11-17 DIAGNOSIS — R928 Other abnormal and inconclusive findings on diagnostic imaging of breast: Secondary | ICD-10-CM

## 2012-11-23 ENCOUNTER — Ambulatory Visit (INDEPENDENT_AMBULATORY_CARE_PROVIDER_SITE_OTHER): Payer: 59 | Admitting: *Deleted

## 2012-11-23 DIAGNOSIS — I4891 Unspecified atrial fibrillation: Secondary | ICD-10-CM

## 2012-11-23 LAB — POCT INR: INR: 3.3

## 2012-12-02 ENCOUNTER — Encounter: Payer: Self-pay | Admitting: Cardiology

## 2012-12-03 ENCOUNTER — Other Ambulatory Visit: Payer: Self-pay | Admitting: Cardiology

## 2012-12-08 ENCOUNTER — Ambulatory Visit (INDEPENDENT_AMBULATORY_CARE_PROVIDER_SITE_OTHER): Payer: 59 | Admitting: Pharmacist

## 2012-12-08 ENCOUNTER — Other Ambulatory Visit: Payer: 59

## 2012-12-08 DIAGNOSIS — I4891 Unspecified atrial fibrillation: Secondary | ICD-10-CM

## 2012-12-08 LAB — POCT INR: INR: 2.5

## 2012-12-24 ENCOUNTER — Other Ambulatory Visit: Payer: Self-pay | Admitting: Cardiology

## 2012-12-29 ENCOUNTER — Ambulatory Visit (INDEPENDENT_AMBULATORY_CARE_PROVIDER_SITE_OTHER): Payer: 59 | Admitting: General Practice

## 2012-12-29 DIAGNOSIS — I4891 Unspecified atrial fibrillation: Secondary | ICD-10-CM

## 2012-12-29 LAB — POCT INR: INR: 2.8

## 2013-01-26 ENCOUNTER — Ambulatory Visit (INDEPENDENT_AMBULATORY_CARE_PROVIDER_SITE_OTHER): Payer: 59

## 2013-01-26 DIAGNOSIS — I4891 Unspecified atrial fibrillation: Secondary | ICD-10-CM

## 2013-01-26 LAB — POCT INR: INR: 2.2

## 2013-02-02 ENCOUNTER — Telehealth: Payer: Self-pay

## 2013-02-03 NOTE — Telephone Encounter (Signed)
Refill

## 2013-02-08 ENCOUNTER — Other Ambulatory Visit: Payer: Self-pay | Admitting: Cardiology

## 2013-02-08 MED ORDER — AMIODARONE HCL 100 MG PO TABS: 100.0000 mg | ORAL_TABLET | Freq: Every day | ORAL | Status: DC

## 2013-02-08 NOTE — Telephone Encounter (Signed)
Filled rx

## 2013-03-09 ENCOUNTER — Ambulatory Visit (INDEPENDENT_AMBULATORY_CARE_PROVIDER_SITE_OTHER): Payer: 59 | Admitting: *Deleted

## 2013-03-09 DIAGNOSIS — I4891 Unspecified atrial fibrillation: Secondary | ICD-10-CM

## 2013-03-09 DIAGNOSIS — Z5181 Encounter for therapeutic drug level monitoring: Secondary | ICD-10-CM | POA: Insufficient documentation

## 2013-03-09 LAB — POCT INR: INR: 3.4

## 2013-03-10 ENCOUNTER — Telehealth: Payer: Self-pay | Admitting: *Deleted

## 2013-03-10 NOTE — Telephone Encounter (Signed)
[facility] [place] [facility]ill for patient. Thanks, MI

## 2013-03-23 ENCOUNTER — Ambulatory Visit (INDEPENDENT_AMBULATORY_CARE_PROVIDER_SITE_OTHER): Payer: 59

## 2013-03-23 DIAGNOSIS — I4891 Unspecified atrial fibrillation: Secondary | ICD-10-CM

## 2013-03-23 DIAGNOSIS — Z5181 Encounter for therapeutic drug level monitoring: Secondary | ICD-10-CM

## 2013-03-23 LAB — POCT INR: INR: 3.4

## 2013-03-23 MED ORDER — AMIODARONE HCL 100 MG PO TABS
100.0000 mg | ORAL_TABLET | Freq: Every day | ORAL | Status: DC
Start: 2013-03-23 — End: 2013-07-08

## 2013-03-24 ENCOUNTER — Other Ambulatory Visit: Payer: Self-pay | Admitting: Cardiology

## 2013-03-24 MED ORDER — SPIRONOLACTONE 25 MG PO TABS: 25.0000 mg | ORAL_TABLET | Freq: Every day | ORAL | Status: DC

## 2013-03-24 NOTE — Telephone Encounter (Signed)
REFILLED

## 2013-03-24 NOTE — Telephone Encounter (Signed)
To Tammy Mckinney

## 2013-04-07 ENCOUNTER — Ambulatory Visit (INDEPENDENT_AMBULATORY_CARE_PROVIDER_SITE_OTHER): Payer: 59 | Admitting: *Deleted

## 2013-04-07 DIAGNOSIS — Z5181 Encounter for therapeutic drug level monitoring: Secondary | ICD-10-CM

## 2013-04-07 DIAGNOSIS — I4891 Unspecified atrial fibrillation: Secondary | ICD-10-CM

## 2013-04-07 LAB — POCT INR: INR: 2.5

## 2013-05-05 ENCOUNTER — Ambulatory Visit (INDEPENDENT_AMBULATORY_CARE_PROVIDER_SITE_OTHER): Payer: 59 | Admitting: Pharmacist

## 2013-05-05 DIAGNOSIS — I4891 Unspecified atrial fibrillation: Secondary | ICD-10-CM

## 2013-05-05 DIAGNOSIS — Z5181 Encounter for therapeutic drug level monitoring: Secondary | ICD-10-CM

## 2013-05-05 LAB — POCT INR: INR: 2.2

## 2013-05-24 ENCOUNTER — Telehealth: Payer: Self-pay | Admitting: Pharmacist

## 2013-05-24 NOTE — Telephone Encounter (Signed)
Message copied by Lou MinerSMART, Sarajane Fambrough G on Wed May 24, 2013  4:59 PM ------      Message from: Kem ParkinsonBARNES, KIMALEXIS      Created: Wed May 24, 2013  4:43 PM       Pt is having teeth extracted Dr. Anne FuSkains said it was ok to Stop Coumadin 5 days with out bridging. He wanted me to let you know ------

## 2013-05-24 NOTE — Telephone Encounter (Signed)
Understood.  Called patient and left a message for her to call back, so we can hopefully get date of procedure.

## 2013-05-25 NOTE — Telephone Encounter (Signed)
She tells me the date hasn't been given yet, and that she will once it is known.

## 2013-06-01 ENCOUNTER — Ambulatory Visit (INDEPENDENT_AMBULATORY_CARE_PROVIDER_SITE_OTHER): Payer: 59 | Admitting: *Deleted

## 2013-06-01 ENCOUNTER — Encounter: Payer: Self-pay | Admitting: Cardiology

## 2013-06-01 ENCOUNTER — Ambulatory Visit (INDEPENDENT_AMBULATORY_CARE_PROVIDER_SITE_OTHER): Payer: 59 | Admitting: Cardiology

## 2013-06-01 VITALS — BP 118/90 | HR 73 | Ht 67.0 in | Wt 262.0 lb

## 2013-06-01 DIAGNOSIS — I422 Other hypertrophic cardiomyopathy: Secondary | ICD-10-CM

## 2013-06-01 DIAGNOSIS — Z5181 Encounter for therapeutic drug level monitoring: Secondary | ICD-10-CM

## 2013-06-01 DIAGNOSIS — I4891 Unspecified atrial fibrillation: Secondary | ICD-10-CM

## 2013-06-01 DIAGNOSIS — Z7901 Long term (current) use of anticoagulants: Secondary | ICD-10-CM

## 2013-06-01 DIAGNOSIS — Z01812 Encounter for preprocedural laboratory examination: Secondary | ICD-10-CM

## 2013-06-01 LAB — POCT INR: INR: 3.8

## 2013-06-01 NOTE — Progress Notes (Signed)
[address] [place], [place]  [address] Phone: [phone] Fax:  [phone]  Date:  06/01/2013   ID:  Tammy BalsamCynthia D Mccolgan, DOB 01/20/1955, MRN [id number]  PCP:  Lolita PatellaEADE,ROBERT ALEXANDER, MD   History of Present Illness: Tammy Mckinney is a 58 y.o. 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  Now in AFIB. Prior sinus brady. She may feel more shortness of breath. She has not missed her amiodarone. She has been therapeutic on Coumadin.  She has no early family history of sudden cardiac death, no syncope, no palpitations, no angina. She also has OSA. and was not utilizing her CPAP machine. At office visit 02/04/12  her heart rate was 48 sinus bradycardia and decreased Verapamil to 180mg  qd. Due to continued slow heart rate we next decreased her Atenolol to 25 mg qd.     Wt Readings from Last 3 Encounters:  06/01/13 262 lb (118.842 kg)     No past medical history on file.  No past surgical history on file.  Current Outpatient Prescriptions  Medication Sig Dispense Refill  . acetaminophen (TYLENOL) 500 MG tablet Take 500 mg by mouth every 6 (six) hours as needed.      Marland Kitchen. albuterol (PROAIR HFA) 108 (90 BASE) MCG/ACT inhaler Inhale into the lungs every 6 (six) hours as needed for wheezing or shortness of breath.      Marland Kitchen. amiodarone (PACERONE) 100 MG tablet Take 1 tablet (100 mg total) by mouth daily.  90 tablet  1  . aspirin 325 MG EC tablet Take 325 mg by mouth daily.      Marland Kitchen. atenolol (TENORMIN) 50 MG tablet Take 0.5 tablets (25 mg total) by mouth daily.  90 tablet  2  . atorvastatin (LIPITOR) 80 MG tablet 1 tab daily      . calcitRIOL (ROCALTROL) 0.25 MCG capsule Take 3 tabs daily      . fluticasone (FLONASE) 50 MCG/ACT nasal spray Place into both nostrils daily.      Marland Kitchen. levothyroxine (SYNTHROID, LEVOTHROID) 100 MCG  tablet Take 1 tab daily      . metFORMIN (GLUCOPHAGE) 500 MG tablet Take 1 tab twice a day      . Multiple Vitamin (MULTIVITAMIN) tablet Take 1 tablet by mouth daily.      . Omega-3 Fatty Acids (FISH OIL) 1000 MG CAPS Take by mouth. 2 tabs daily      . pantoprazole (PROTONIX) 40 MG tablet 1 tab daily      . spironolactone (ALDACTONE) 25 MG tablet Take 1 tablet (25 mg total) by mouth daily.  30 tablet  5  . verapamil (CALAN-SR) 180 MG CR tablet TAKE 1 TABLET IN AM  90 tablet  1  . warfarin (COUMADIN) 5 MG tablet TAKE 1 TABLET EVERYDAY EXCEPT ON MONDAYS, WEDNESDAYS, AND FRIDAYS TAKE 1 AND 1/2 TABLETS  120 tablet  1   No current facility-administered medications for this visit.    Allergies:    Allergies  Allergen Reactions  . Oxycodone Hcl     oxycotin    Social History:  The patient  reports that she has quit smoking. She does not have any smokeless tobacco history on file.   Family History  Problem Relation Age of Onset  . Hypertension Mother   . Heart attack Father   . Heart disease Father   .  Hypertension Father     ROS:  Please see the history of present illness.   Denies any bleeding, syncope, orthopnea, PND   All other systems reviewed and negative.   PHYSICAL EXAM: VS:  BP 118/90  Pulse 73  Ht 5\' 7"  (1.702 m)  Wt 262 lb (118.842 kg)  BMI 41.03 kg/m2 Well nourished, well developed, in no acute distress HEENT: normal, [place]/AT, EOMI Neck: no JVD, normal carotid upstroke, no bruit Cardiac:  normal S1, S2; RRR; no murmur Lungs:  clear to auscultation bilaterally, no wheezing, rhonchi or rales Abd: soft, nontender, no hepatomegaly, no bruits Ext: no edema, 2+ distal pulses Skin: warm and dry GU: deferred Neuro: no focal abnormalities noted, AAO x 3  EKG:  06/01/13-atrial fibrillation heart rate 63, LDH, left axis deviation    ECHO: 08/11/10 -  1. Severe asymmetric septal hypertrophy. HOCM. 2. Left ventricular ejection fraction estimated by 2D at 50-55 percent. 3. There  were no regional wall motion abnormalities. 4. Moderate left atrial enlargement. 5. Trace mitral valve regurgitation. 6. Trivial tricuspid regurgitation. 7. Mild calcification of the aortic valve. 8. Trace of pulmonic valve regurgitation. 9. Analysis of mitral valve inflow, pulmonary vein Doppler and tissue Doppler suggests grade I diastolic dysfunction without elevated left atrial pressure.  Holter 7/12 - occasional PVCs, PACs, no atrial fibrillation, no ventricular tachycardia  ASSESSMENT AND PLAN:  1. Atrial fibrillation-paroxysmal. Currently in atrial fibrillation. With her hypertrophic cardio myopathy, I will proceed with cardioversion. She has been therapeutic on Coumadin. We will check a CBC, basic metabolic profile and PT. I have explained to her the risks and benefits of the procedure including but not limited to stroke. 2. Hypertrophic cardiomyopathy-severe asymmetric septal hypertrophy, ejection fraction 50-55%. Trying to maintain sinus rhythm. 3. Obesity-encourage weight loss. 4. Chronic anticoagulation-Jeremy Smart, Pharm.D. Please see note 5. See back in 3 weeks.   Signed, [name], MD [facility]  06/01/2013 4:01 PM

## 2013-06-01 NOTE — Patient Instructions (Signed)
Your physician recommends that you continue on your current medications as directed. Please refer to the Current Medication list given to you today.  Your physician recommends that you have labs today: BMET, CBC  Your physician has recommended that you have a Cardioversion (DCCV). Electrical Cardioversion uses a jolt of electricity to your heart either through paddles or wired patches attached to your chest. This is a controlled, usually prescheduled, procedure. Defibrillation is done under light anesthesia in the hospital, and you usually go home the day of the procedure. This is done to get your heart back into a normal rhythm. You are not awake for the procedure. Please see the instruction sheet given to you today.

## 2013-06-02 ENCOUNTER — Encounter (HOSPITAL_COMMUNITY): Payer: Self-pay | Admitting: Pharmacist

## 2013-06-02 ENCOUNTER — Other Ambulatory Visit: Payer: Self-pay

## 2013-06-02 LAB — CBC WITH DIFFERENTIAL/PLATELET
BASOS ABS: 0 10*3/uL (ref 0.0–0.1)
Basophils Relative: 0.4 % (ref 0.0–3.0)
EOS PCT: 2.5 % (ref 0.0–5.0)
Eosinophils Absolute: 0.2 10*3/uL (ref 0.0–0.7)
HCT: 39.9 % (ref 36.0–46.0)
Hemoglobin: 12.9 g/dL (ref 12.0–15.0)
Lymphocytes Relative: 31.7 % (ref 12.0–46.0)
Lymphs Abs: 2.6 10*3/uL (ref 0.7–4.0)
MCHC: 32.3 g/dL (ref 30.0–36.0)
MCV: 92.9 fl (ref 78.0–100.0)
MONO ABS: 0.5 10*3/uL (ref 0.1–1.0)
Monocytes Relative: 6.1 % (ref 3.0–12.0)
NEUTROS PCT: 59.3 % (ref 43.0–77.0)
Neutro Abs: 4.8 10*3/uL (ref 1.4–7.7)
PLATELETS: 433 10*3/uL — AB (ref 150.0–400.0)
RBC: 4.29 Mil/uL (ref 3.87–5.11)
RDW: 14 % (ref 11.5–15.5)
WBC: 8.1 10*3/uL (ref 4.0–10.5)

## 2013-06-02 LAB — BASIC METABOLIC PANEL
BUN: 13 mg/dL (ref 6–23)
CO2: 33 meq/L — AB (ref 19–32)
Calcium: 9.2 mg/dL (ref 8.4–10.5)
Chloride: 102 mEq/L (ref 96–112)
Creatinine, Ser: 0.9 mg/dL (ref 0.4–1.2)
GFR: 80.51 mL/min (ref >60.00)
GLUCOSE: 94 mg/dL (ref 70–99)
Potassium: 4.7 mEq/L (ref 3.5–5.1)
SODIUM: 141 meq/L (ref 135–145)

## 2013-06-02 MED ORDER — WARFARIN SODIUM 5 MG PO TABS: ORAL_TABLET | ORAL | Status: DC

## 2013-06-08 NOTE — Progress Notes (Signed)
Spoke with patient's mother , advised that labs where ok.

## 2013-06-09 ENCOUNTER — Encounter (HOSPITAL_COMMUNITY): Payer: 59 | Admitting: Anesthesiology

## 2013-06-09 ENCOUNTER — Encounter (HOSPITAL_COMMUNITY): Payer: Self-pay

## 2013-06-09 ENCOUNTER — Ambulatory Visit (HOSPITAL_COMMUNITY): Payer: 59 | Admitting: Anesthesiology

## 2013-06-09 ENCOUNTER — Ambulatory Visit (INDEPENDENT_AMBULATORY_CARE_PROVIDER_SITE_OTHER): Payer: 59

## 2013-06-09 ENCOUNTER — Encounter (HOSPITAL_COMMUNITY)
Admission: RE | Disposition: A | Discharge status: Home / Self Care | Payer: 59 | Source: Ambulatory Visit | Attending: Cardiology

## 2013-06-09 ENCOUNTER — Ambulatory Visit (HOSPITAL_COMMUNITY)
Admission: RE | Admit: 2013-06-09 | Discharge: 2013-06-09 | Disposition: A | Discharge status: Home / Self Care | Payer: 59 | Source: Ambulatory Visit | Attending: Cardiology | Admitting: Cardiology

## 2013-06-09 DIAGNOSIS — Z7901 Long term (current) use of anticoagulants: Secondary | ICD-10-CM | POA: Insufficient documentation

## 2013-06-09 DIAGNOSIS — Z5181 Encounter for therapeutic drug level monitoring: Secondary | ICD-10-CM

## 2013-06-09 DIAGNOSIS — Z7982 Long term (current) use of aspirin: Secondary | ICD-10-CM | POA: Insufficient documentation

## 2013-06-09 DIAGNOSIS — G4730 Sleep apnea, unspecified: Secondary | ICD-10-CM | POA: Insufficient documentation

## 2013-06-09 DIAGNOSIS — Z885 Allergy status to narcotic agent status: Secondary | ICD-10-CM | POA: Insufficient documentation

## 2013-06-09 DIAGNOSIS — Z87891 Personal history of nicotine dependence: Secondary | ICD-10-CM | POA: Insufficient documentation

## 2013-06-09 DIAGNOSIS — E669 Obesity, unspecified: Secondary | ICD-10-CM | POA: Insufficient documentation

## 2013-06-09 DIAGNOSIS — I4891 Unspecified atrial fibrillation: Secondary | ICD-10-CM | POA: Insufficient documentation

## 2013-06-09 DIAGNOSIS — E039 Hypothyroidism, unspecified: Secondary | ICD-10-CM | POA: Insufficient documentation

## 2013-06-09 DIAGNOSIS — I422 Other hypertrophic cardiomyopathy: Secondary | ICD-10-CM | POA: Insufficient documentation

## 2013-06-09 DIAGNOSIS — Z79899 Other long term (current) drug therapy: Secondary | ICD-10-CM | POA: Insufficient documentation

## 2013-06-09 DIAGNOSIS — I10 Essential (primary) hypertension: Secondary | ICD-10-CM | POA: Insufficient documentation

## 2013-06-09 DIAGNOSIS — K219 Gastro-esophageal reflux disease without esophagitis: Secondary | ICD-10-CM | POA: Insufficient documentation

## 2013-06-09 DIAGNOSIS — E119 Type 2 diabetes mellitus without complications: Secondary | ICD-10-CM | POA: Insufficient documentation

## 2013-06-09 HISTORY — DX: Essential (primary) hypertension: I10

## 2013-06-09 HISTORY — DX: Cardiac arrhythmia, unspecified: I49.9

## 2013-06-09 HISTORY — DX: Sleep apnea, unspecified: G47.30

## 2013-06-09 HISTORY — PX: CARDIOVERSION: SHX1299

## 2013-06-09 HISTORY — DX: Paralysis of vocal cords and larynx, unspecified: J38.00

## 2013-06-09 HISTORY — DX: Hypothyroidism, unspecified: E03.9

## 2013-06-09 HISTORY — DX: Type 2 diabetes mellitus without complications: E11.9

## 2013-06-09 HISTORY — DX: Gastro-esophageal reflux disease without esophagitis: K21.9

## 2013-06-09 HISTORY — DX: Unspecified diastolic (congestive) heart failure: I50.30

## 2013-06-09 LAB — GLUCOSE, CAPILLARY: GLUCOSE-CAPILLARY: 118 mg/dL — AB (ref 70–99)

## 2013-06-09 LAB — POCT INR: INR: 3

## 2013-06-09 SURGERY — CARDIOVERSION
Anesthesia: Monitor Anesthesia Care

## 2013-06-09 MED ORDER — SODIUM CHLORIDE 0.9 % IV SOLN
INTRAVENOUS | Status: DC | PRN
  Administered 2013-06-09: 11:00:00 via INTRAVENOUS

## 2013-06-09 MED ORDER — SODIUM CHLORIDE 0.9 % IJ SOLN: 3.0000 mL | Freq: Two times a day (BID) | INTRAMUSCULAR | Status: DC

## 2013-06-09 MED ORDER — SODIUM CHLORIDE 0.9 % IV SOLN: 250.0000 mL | INTRAVENOUS | Status: DC

## 2013-06-09 MED ORDER — PROPOFOL 10 MG/ML IV BOLUS
INTRAVENOUS | Status: DC | PRN
  Administered 2013-06-09: 60 mg via INTRAVENOUS

## 2013-06-09 MED ORDER — LIDOCAINE HCL (CARDIAC) 20 MG/ML IV SOLN
INTRAVENOUS | Status: DC | PRN
  Administered 2013-06-09: 40 mg via INTRAVENOUS

## 2013-06-09 MED ORDER — SODIUM CHLORIDE 0.9 % IJ SOLN: 3.0000 mL | INTRAMUSCULAR | Status: DC | PRN

## 2013-06-09 NOTE — CV Procedure (Signed)
    Electrical Cardioversion Procedure Note Tammy Mckinney [id number] 1955/03/16  Procedure: Electrical Cardioversion Indications:  Atrial Fibrillation  Time Out: Verified patient identification, verified procedure,medications/allergies/relevent history reviewed, required imaging and test results available.  Performed  Procedure Details  The patient was NPO after midnight. Anesthesia was administered at the beside  by Dr. Felicita Gage with 60mg  of propofol.  Cardioversion was performed with synchronized biphasic defibrillation via AP pads with 150 joules.  1 attempt(s) were performed.  The patient converted to normal sinus rhythm. The patient tolerated the procedure well   IMPRESSION:  Successful cardioversion of atrial fibrillation Continue anticoagulation. Amio 100.    [name] 06/09/2013, 11:16 AM

## 2013-06-09 NOTE — Anesthesia Preprocedure Evaluation (Addendum)
Anesthesia Evaluation  Patient identified by MRN, date of birth, ID band Patient awake    Reviewed: Allergy & Precautions, H&P , NPO status , Patient's Chart, lab work & pertinent test results  Airway Mallampati: II  Neck ROM: full    Dental   Pulmonary sleep apnea , former smoker,          Cardiovascular hypertension, + dysrhythmias Atrial Fibrillation     Neuro/Psych    GI/Hepatic GERD-  ,  Endo/Other  diabetes, Type 2Hypothyroidism Morbid obesity  Renal/GU      Musculoskeletal   Abdominal   Peds  Hematology   Anesthesia Other Findings   Reproductive/Obstetrics                          Anesthesia Physical Anesthesia Plan  ASA: III  Anesthesia Plan: MAC   Post-op Pain Management:    Induction: Intravenous  Airway Management Planned: Mask  Additional Equipment:   Intra-op Plan:   Post-operative Plan:   Informed Consent: I have reviewed the patients History and Physical, chart, labs and discussed the procedure including the risks, benefits and alternatives for the proposed anesthesia with the patient or authorized representative who has indicated his/her understanding and acceptance.     Plan Discussed with: CRNA, Anesthesiologist and Surgeon  Anesthesia Plan Comments:         Anesthesia Quick Evaluation

## 2013-06-09 NOTE — Addendum Note (Signed)
Addendum created 06/09/13 1214 by Coralee Rud, CRNA   Modules edited: Charges VN

## 2013-06-09 NOTE — Anesthesia Postprocedure Evaluation (Signed)
  Anesthesia Post-op Note  Patient: Tammy Mckinney  Procedure(s) Performed: Procedure(s): CARDIOVERSION (N/A)  Patient Location: Endoscopy Unit  Anesthesia Type:General  Level of Consciousness: awake, alert  and patient cooperative  Airway and Oxygen Therapy: Patient Spontanous Breathing and Patient connected to nasal cannula oxygen  Post-op Pain: none  Post-op Assessment: Post-op Vital signs reviewed, Patient's Cardiovascular Status Stable, Respiratory Function Stable and Patent Airway  Post-op Vital Signs: Reviewed and stable  Last Vitals:  Filed Vitals:   06/09/13 1124  BP: 108/77  Temp:   Resp: 19    Complications: No apparent anesthesia complications

## 2013-06-09 NOTE — H&P (View-Only) (Signed)
[address] [place], [place]  [address] Phone: [phone] Fax:  [phone]([phone]  Date:  06/01/2013   ID:  Tammy BalsamCynthia D Mckinney, DOB 01/20/1955, MRN [id number]  PCP:  Tammy PatellaEADE,ROBERT ALEXANDER, MD   History of Present Illness: Tammy Mckinney is a 58 y.o. 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  Now in AFIB. Prior sinus brady. She may feel more shortness of breath. She has not missed her amiodarone. She has been therapeutic on Coumadin.  She has no early family history of sudden cardiac death, no syncope, no palpitations, no angina. She also has OSA. and was not utilizing her CPAP machine. At office visit 02/04/12  her heart rate was 48 sinus bradycardia and decreased Verapamil to 180mg  qd. Due to continued slow heart rate we next decreased her Atenolol to 25 mg qd.     Wt Readings from Last 3 Encounters:  06/01/13 262 lb (118.842 kg)     No past medical history on file.  No past surgical history on file.  Current Outpatient Prescriptions  Medication Sig Dispense Refill  . acetaminophen (TYLENOL) 500 MG tablet Take 500 mg by mouth every 6 (six) hours as needed.      Marland Kitchen. albuterol (PROAIR HFA) 108 (90 BASE) MCG/ACT inhaler Inhale into the lungs every 6 (six) hours as needed for wheezing or shortness of breath.      Marland Kitchen. amiodarone (PACERONE) 100 MG tablet Take 1 tablet (100 mg total) by mouth daily.  90 tablet  1  . aspirin 325 MG EC tablet Take 325 mg by mouth daily.      Marland Kitchen. atenolol (TENORMIN) 50 MG tablet Take 0.5 tablets (25 mg total) by mouth daily.  90 tablet  2  . atorvastatin (LIPITOR) 80 MG tablet 1 tab daily      . calcitRIOL (ROCALTROL) 0.25 MCG capsule Take 3 tabs daily      . fluticasone (FLONASE) 50 MCG/ACT nasal spray Place into both nostrils daily.      Marland Kitchen. levothyroxine (SYNTHROID, LEVOTHROID) 100 MCG  tablet Take 1 tab daily      . metFORMIN (GLUCOPHAGE) 500 MG tablet Take 1 tab twice a day      . Multiple Vitamin (MULTIVITAMIN) tablet Take 1 tablet by mouth daily.      . Omega-3 Fatty Acids (FISH OIL) 1000 MG CAPS Take by mouth. 2 tabs daily      . pantoprazole (PROTONIX) 40 MG tablet 1 tab daily      . spironolactone (ALDACTONE) 25 MG tablet Take 1 tablet (25 mg total) by mouth daily.  30 tablet  5  . verapamil (CALAN-SR) 180 MG CR tablet TAKE 1 TABLET IN AM  90 tablet  1  . warfarin (COUMADIN) 5 MG tablet TAKE 1 TABLET EVERYDAY EXCEPT ON MONDAYS, WEDNESDAYS, AND FRIDAYS TAKE 1 AND 1/2 TABLETS  120 tablet  1   No current facility-administered medications for this visit.    Allergies:    Allergies  Allergen Reactions  . Oxycodone Hcl     oxycotin    Social History:  The patient  reports that she has quit smoking. She does not have any smokeless tobacco history on file.   Family History  Problem Relation Age of Onset  . Hypertension Mother   . Heart attack Father   . Heart disease Father   .  Hypertension Father     ROS:  Please see the history of present illness.   Denies any bleeding, syncope, orthopnea, PND   All other systems reviewed and negative.   PHYSICAL EXAM: VS:  BP 118/90  Pulse 73  Ht 5\' 7"  (1.702 m)  Wt 262 lb (118.842 kg)  BMI 41.03 kg/m2 Well nourished, well developed, in no acute distress HEENT: normal, [place]/AT, EOMI Neck: no JVD, normal carotid upstroke, no bruit Cardiac:  normal S1, S2; RRR; no murmur Lungs:  clear to auscultation bilaterally, no wheezing, rhonchi or rales Abd: soft, nontender, no hepatomegaly, no bruits Ext: no edema, 2+ distal pulses Skin: warm and dry GU: deferred Neuro: no focal abnormalities noted, AAO x 3  EKG:  06/01/13-atrial fibrillation heart rate 63, LDH, left axis deviation    ECHO: 08/11/10 -  1. Severe asymmetric septal hypertrophy. HOCM. 2. Left ventricular ejection fraction estimated by 2D at 50-55 percent. 3. There  were no regional wall motion abnormalities. 4. Moderate left atrial enlargement. 5. Trace mitral valve regurgitation. 6. Trivial tricuspid regurgitation. 7. Mild calcification of the aortic valve. 8. Trace of pulmonic valve regurgitation. 9. Analysis of mitral valve inflow, pulmonary vein Doppler and tissue Doppler suggests grade I diastolic dysfunction without elevated left atrial pressure.  Holter 7/12 - occasional PVCs, PACs, no atrial fibrillation, no ventricular tachycardia  ASSESSMENT AND PLAN:  1. Atrial fibrillation-paroxysmal. Currently in atrial fibrillation. With her hypertrophic cardio myopathy, I will proceed with cardioversion. She has been therapeutic on Coumadin. We will check a CBC, basic metabolic profile and PT. I have explained to her the risks and benefits of the procedure including but not limited to stroke. 2. Hypertrophic cardiomyopathy-severe asymmetric septal hypertrophy, ejection fraction 50-55%. Trying to maintain sinus rhythm. 3. Obesity-encourage weight loss. 4. Chronic anticoagulation-Jeremy Smart, Pharm.D. Please see note 5. See back in 3 weeks.   Signed, [name], MD [facility]  06/01/2013 4:01 PM

## 2013-06-09 NOTE — Transfer of Care (Signed)
Immediate Anesthesia Transfer of Care Note  Patient: Tammy Mckinney  Procedure(s) Performed: Procedure(s): CARDIOVERSION (N/A)  Patient Location: Endoscopy Unit  Anesthesia Type:General  Level of Consciousness: awake, alert  and patient cooperative  Airway & Oxygen Therapy: Patient Spontanous Breathing and Patient connected to nasal cannula oxygen  Post-op Assessment: Report given to PACU RN, Post -op Vital signs reviewed and stable, Patient moving all extremities and Patient moving all extremities X 4  Post vital signs: Reviewed and stable  Complications: No apparent anesthesia complications

## 2013-06-09 NOTE — Interval H&P Note (Signed)
History and Physical Interval Note:  06/09/2013 6:20 AM  Tammy Mckinney  has presented today for surgery, with the diagnosis of AFIB  The various methods of treatment have been discussed with the patient and family. After consideration of risks, benefits and other options for treatment, the patient has consented to  Procedure(s): CARDIOVERSION (N/A) as a surgical intervention .  The patient's history has been reviewed, patient examined, no change in status, stable for surgery.  I have reviewed the patient's chart and labs.  Questions were answered to the patient's satisfaction.     [employer]

## 2013-06-09 NOTE — Discharge Instructions (Addendum)
Monitored Anesthesia Care  °Monitored anesthesia care is an anesthesia service for a medical procedure. Anesthesia is the loss of the ability to feel pain. It is produced by medications called anesthetics. It may affect a small area of your body (local anesthesia), a large area of your body (regional anesthesia), or your entire body (general anesthesia). The need for monitored anesthesia care depends your procedure, your condition, and the potential need for regional or general anesthesia. It is often provided during procedures where:  °· General anesthesia may be needed if there are complications. This is because you need special care when you are under general anesthesia.   °· You will be under local or regional anesthesia. This is so that you are able to have higher levels of anesthesia if needed.   °· You will receive calming medications (sedatives). This is especially the case if sedatives are given to put you in a semi-conscious state of relaxation (deep sedation). This is because the amount of sedative needed to produce this state can be hard to predict. Too much of a sedative can produce general anesthesia. °Monitored anesthesia care is performed by one or more caregivers who have special training in all types of anesthesia. You will need to meet with these caregivers before your procedure. During this meeting, they will ask you about your medical history. They will also give you instructions to follow. (For example, you will need to stop eating and drinking before your procedure. You may also need to stop or change medications you are taking.) During your procedure, your caregivers will stay with you. They will:  °· Watch your condition. This includes watching you blood pressure, breathing, and level of pain.   °· Diagnose and treat problems that occur.   °· Give medications if they are needed. These may include calming medications (sedatives) and anesthetics.   °· Make sure you are comfortable.   °Having  monitored anesthesia care does not necessarily mean that you will be under anesthesia. It does mean that your caregivers will be able to manage anesthesia if you need it or if it occurs. It also means that you will be able to have a different type of anesthesia than you are having if you need it. When your procedure is complete, your caregivers will continue to watch your condition. They will make sure any medications wear off before you are allowed to go home.  °Document Released: 10/01/2004 Document Revised: 05/02/2012 Document Reviewed: 02/17/2012 °ExitCare® Patient Information ©2014 [employer]. °Electrical Cardioversion °Electrical cardioversion is the delivery of a jolt of electricity to change the rhythm of the heart. Sticky patches or metal paddles are placed on the chest to deliver the electricity from a device. This is done to restore a normal rhythm. A rhythm that is too fast or not regular keeps the heart from pumping well. °Electrical cardioversion is done in an emergency if:  °· There is low or no blood pressure as a result of the heart rhythm.   °· Normal rhythm must be restored as fast as possible to protect the brain and heart from further damage.   °· It may save a life. °Cardioversion may be done for heart rhythms that are not immediately life-threatening, such as atrial fibrillation or flutter, in which:  °· The heart is beating too fast or is not regular.   °· Medicine to change the rhythm has not worked.   °· It is safe to wait in order to allow time for preparation. °· Symptoms of the abnormal rhythm are bothersome. °· The risk of   stroke and other serious complications can be reduced. °LET YOUR CAREGIVER KNOW ABOUT:  °· All medicines you are taking, including vitamins, herbs, eye drops, creams, and over-the-counter medicines.   °· Previous problems you or members of your family have had with the use of anesthetics.   °· Any blood disorders you have.   °· Previous surgeries you have had.    °· Medical conditions you have. °RISKS AND COMPLICATIONS  °Generally, this is a safe procedure. However, as with any procedure, complications can occur. Possible complications include:  °· Breathing problems related to the anesthetic used. °· Cardiac arrest This risk is rare. °· A blood clot that breaks free and travels to other parts of your body. This could cause a stroke or other problems. The risk of this is lowered by use of blood thinning medicine (anticoagulant) prior to the procedure. °BEFORE THE PROCEDURE  °· You may have tests to detect blood clots in your heart and evaluate heart function.  °· You may start taking anticoagulants so your blood does not clot as easily.   °· Medicines may be given to help stabilize your heart rate and rhythm. °PROCEDURE °· You will be given medicine through an IV tube to reduce discomfort and make you sleepy (sedative).   °· An electrical shock will be delivered. °AFTER THE PROCEDURE °Your heart rhythm will be watched to make sure it does not change. You may be able to go home within a few hours.  °Document Released: 12/26/2001 Document Revised: 10/26/2012 Document Reviewed: 07/20/2012 °ExitCare® Patient Information ©2014 [employer]. ° °

## 2013-06-12 ENCOUNTER — Encounter (HOSPITAL_COMMUNITY): Payer: Self-pay | Admitting: Cardiology

## 2013-06-15 ENCOUNTER — Ambulatory Visit (INDEPENDENT_AMBULATORY_CARE_PROVIDER_SITE_OTHER): Payer: 59

## 2013-06-15 DIAGNOSIS — I4891 Unspecified atrial fibrillation: Secondary | ICD-10-CM

## 2013-06-15 DIAGNOSIS — Z5181 Encounter for therapeutic drug level monitoring: Secondary | ICD-10-CM

## 2013-06-15 LAB — POCT INR: INR: 2.6

## 2013-07-07 ENCOUNTER — Ambulatory Visit (INDEPENDENT_AMBULATORY_CARE_PROVIDER_SITE_OTHER): Payer: 59 | Admitting: *Deleted

## 2013-07-07 DIAGNOSIS — I4891 Unspecified atrial fibrillation: Secondary | ICD-10-CM

## 2013-07-07 DIAGNOSIS — Z5181 Encounter for therapeutic drug level monitoring: Secondary | ICD-10-CM

## 2013-07-07 LAB — POCT INR: INR: 2.4

## 2013-07-08 ENCOUNTER — Other Ambulatory Visit: Payer: Self-pay | Admitting: Cardiology

## 2013-07-11 ENCOUNTER — Telehealth: Payer: Self-pay | Admitting: Cardiology

## 2013-07-11 NOTE — Telephone Encounter (Signed)
Returned call to patient she stated she needed Dr.Skains to clear her to have her top teeth pulled.Stated she takes coumadin.Message sent to Dr.Skains for advice.

## 2013-07-11 NOTE — Telephone Encounter (Signed)
Returned call to patient Dr.Skains advised ok to stop coumadin 4 days prior to tooth extractions.Resume coumadin the following day.Advised when she knows date of extractions call back and let coumadin clinic know.

## 2013-07-11 NOTE — Telephone Encounter (Signed)
New message    Need nurse to contact the dentist office for approval to have surgery.    Dr. Remigio [place] office.

## 2013-07-11 NOTE — Telephone Encounter (Signed)
OK to stop coumadin for 4 days prior to tooth extraction. (She is post cardioversion >4 weeks). Resume coumadin the following day of extraction.

## 2013-08-04 ENCOUNTER — Ambulatory Visit (INDEPENDENT_AMBULATORY_CARE_PROVIDER_SITE_OTHER): Payer: 59 | Admitting: *Deleted

## 2013-08-04 DIAGNOSIS — I4891 Unspecified atrial fibrillation: Secondary | ICD-10-CM

## 2013-08-04 DIAGNOSIS — Z5181 Encounter for therapeutic drug level monitoring: Secondary | ICD-10-CM

## 2013-08-04 LAB — POCT INR: INR: 2.5

## 2013-08-15 ENCOUNTER — Other Ambulatory Visit: Payer: Self-pay | Admitting: *Deleted

## 2013-08-15 MED ORDER — VERAPAMIL HCL ER 180 MG PO TBCR: 180.0000 mg | EXTENDED_RELEASE_TABLET | Freq: Every morning | ORAL | Status: DC

## 2013-09-01 ENCOUNTER — Ambulatory Visit (INDEPENDENT_AMBULATORY_CARE_PROVIDER_SITE_OTHER): Payer: 59 | Admitting: Pharmacist

## 2013-09-01 DIAGNOSIS — Z5181 Encounter for therapeutic drug level monitoring: Secondary | ICD-10-CM

## 2013-09-01 DIAGNOSIS — I4891 Unspecified atrial fibrillation: Secondary | ICD-10-CM

## 2013-09-01 LAB — POCT INR: INR: 2.1

## 2013-10-05 ENCOUNTER — Other Ambulatory Visit: Payer: Self-pay | Admitting: Cardiology

## 2013-10-12 ENCOUNTER — Ambulatory Visit
Admission: RE | Admit: 2013-10-12 | Discharge: 2013-10-12 | Disposition: A | Discharge status: Home / Self Care | Payer: 59 | Source: Ambulatory Visit | Attending: Family Medicine | Admitting: Family Medicine

## 2013-10-12 ENCOUNTER — Other Ambulatory Visit: Payer: Self-pay | Admitting: Family Medicine

## 2013-10-12 DIAGNOSIS — R079 Chest pain, unspecified: Secondary | ICD-10-CM

## 2013-11-13 ENCOUNTER — Ambulatory Visit (INDEPENDENT_AMBULATORY_CARE_PROVIDER_SITE_OTHER): Payer: 59

## 2013-11-13 DIAGNOSIS — I4891 Unspecified atrial fibrillation: Secondary | ICD-10-CM

## 2013-11-13 DIAGNOSIS — Z5181 Encounter for therapeutic drug level monitoring: Secondary | ICD-10-CM

## 2013-11-13 LAB — POCT INR: INR: 1.8

## 2013-11-25 ENCOUNTER — Other Ambulatory Visit: Payer: Self-pay | Admitting: Cardiology

## 2013-11-27 ENCOUNTER — Ambulatory Visit (INDEPENDENT_AMBULATORY_CARE_PROVIDER_SITE_OTHER): Payer: 59

## 2013-11-27 DIAGNOSIS — Z5181 Encounter for therapeutic drug level monitoring: Secondary | ICD-10-CM

## 2013-11-27 DIAGNOSIS — I4891 Unspecified atrial fibrillation: Secondary | ICD-10-CM

## 2013-11-27 LAB — POCT INR: INR: 2

## 2013-12-18 ENCOUNTER — Ambulatory Visit (INDEPENDENT_AMBULATORY_CARE_PROVIDER_SITE_OTHER): Payer: 59

## 2013-12-18 DIAGNOSIS — I4891 Unspecified atrial fibrillation: Secondary | ICD-10-CM

## 2013-12-18 DIAGNOSIS — Z5181 Encounter for therapeutic drug level monitoring: Secondary | ICD-10-CM

## 2013-12-18 LAB — POCT INR: INR: 2.7

## 2014-01-01 ENCOUNTER — Other Ambulatory Visit: Payer: Self-pay | Admitting: Cardiology

## 2014-01-15 ENCOUNTER — Ambulatory Visit (INDEPENDENT_AMBULATORY_CARE_PROVIDER_SITE_OTHER): Payer: 59

## 2014-01-15 DIAGNOSIS — I4891 Unspecified atrial fibrillation: Secondary | ICD-10-CM

## 2014-01-15 DIAGNOSIS — Z5181 Encounter for therapeutic drug level monitoring: Secondary | ICD-10-CM

## 2014-01-15 LAB — POCT INR: INR: 2.6

## 2014-02-26 ENCOUNTER — Ambulatory Visit (INDEPENDENT_AMBULATORY_CARE_PROVIDER_SITE_OTHER): Payer: 59 | Admitting: *Deleted

## 2014-02-26 DIAGNOSIS — Z5181 Encounter for therapeutic drug level monitoring: Secondary | ICD-10-CM

## 2014-02-26 DIAGNOSIS — I4891 Unspecified atrial fibrillation: Secondary | ICD-10-CM

## 2014-02-26 LAB — POCT INR: INR: 2.1

## 2014-03-03 ENCOUNTER — Other Ambulatory Visit: Payer: Self-pay | Admitting: Cardiology

## 2014-04-02 ENCOUNTER — Other Ambulatory Visit: Payer: Self-pay | Admitting: Cardiology

## 2014-04-09 ENCOUNTER — Ambulatory Visit (INDEPENDENT_AMBULATORY_CARE_PROVIDER_SITE_OTHER): Payer: 59 | Admitting: *Deleted

## 2014-04-09 DIAGNOSIS — Z5181 Encounter for therapeutic drug level monitoring: Secondary | ICD-10-CM

## 2014-04-09 DIAGNOSIS — I4891 Unspecified atrial fibrillation: Secondary | ICD-10-CM

## 2014-04-09 LAB — POCT INR: INR: 1.9

## 2014-04-21 ENCOUNTER — Other Ambulatory Visit: Payer: Self-pay | Admitting: Cardiology

## 2014-05-09 ENCOUNTER — Ambulatory Visit (INDEPENDENT_AMBULATORY_CARE_PROVIDER_SITE_OTHER): Payer: 59 | Admitting: Cardiology

## 2014-05-09 ENCOUNTER — Encounter: Payer: Self-pay | Admitting: Cardiology

## 2014-05-09 VITALS — BP 130/74 | HR 56 | Ht 67.0 in | Wt 253.0 lb

## 2014-05-09 DIAGNOSIS — I480 Paroxysmal atrial fibrillation: Secondary | ICD-10-CM

## 2014-05-09 DIAGNOSIS — I421 Obstructive hypertrophic cardiomyopathy: Secondary | ICD-10-CM | POA: Diagnosis not present

## 2014-05-09 DIAGNOSIS — Z7901 Long term (current) use of anticoagulants: Secondary | ICD-10-CM | POA: Diagnosis not present

## 2014-05-09 MED ORDER — AMIODARONE HCL 200 MG PO TABS: 200.0000 mg | ORAL_TABLET | Freq: Every day | ORAL | Status: DC

## 2014-05-09 NOTE — Patient Instructions (Signed)
Medication Instructions:  Please increase your Amiodarone to 200 mg a day. Continue all other medications as listed.  Labwork: none  Testing/Procedures: none  Follow-Up: 6 weeks  Thank you for choosing [facility]!!

## 2014-05-09 NOTE — Progress Notes (Signed)
[address] [place], [place]  [address] Phone: [phone] Fax:  [phone](336) [phone]  Date:  05/09/2014   ID:  Tammy BalsamCynthia D Mckinney, DOB 03/29/1955, MRN [id number]  PCP:  Lolita PatellaEADE,ROBERT ALEXANDER, MD   History of Present Illness: Tammy Mckinney is a 59 y.o. 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  Now in AFIB once again on amio 100 with cardioversion 5/14.  Prior sinus brady. She may feel more shortness of breath. She has not missed her amiodarone. She has been therapeutic on Coumadin.  She has no early family history of sudden cardiac death, no syncope, no palpitations, no angina. She also has OSA. and was not utilizing her CPAP machine. At office visit 02/04/12  her heart rate was 48 sinus bradycardia and decreased Verapamil to 180mg  qd. Due to continued slow heart rate we next decreased her Atenolol to 25 mg qd.     Wt Readings from Last 3 Encounters:  05/09/14 253 lb (114.76 kg)  06/01/13 262 lb (118.842 kg)     Past Medical History  Diagnosis Date  . Hypertension   . Dysrhythmia   . Sleep apnea     no cpap  . (HFpEF) heart failure with preserved ejection fraction   . 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 Hypothyroidism   . Diabetes mellitus without complication   . GERD (gastroesophageal reflux disease)     Past Surgical History  Procedure Laterality Date  . Abdominal hysterectomy    . Cholecystectomy    . Thyroidectomy    . Cardioversion N/A 06/09/2013    Procedure: CARDIOVERSION;  Surgeon: Donato SchultzMark Skains, MD;  [facility];  Service: Cardiovascular;  Laterality: N/A;    Current Outpatient Prescriptions  Medication Sig Dispense Refill  . acetaminophen (TYLENOL) 500 MG tablet Take 1,000 mg by mouth every 6 (six) hours as needed (pain).     Marland Kitchen. amiodarone (PACERONE) 100 MG tablet TAKE 1  TABLET (100 MG TOTAL) BY MOUTH DAILY. 30 tablet 1  . atenolol (TENORMIN) 50 MG tablet Take 0.5 tablets (25 mg total) by mouth daily. 90 tablet 2  . atorvastatin (LIPITOR) 80 MG tablet Take 80 mg by mouth every evening.     . calcitRIOL (ROCALTROL) 0.25 MCG capsule Take 0.75 mcg by mouth every morning.     . fluticasone (FLONASE) 50 MCG/ACT nasal spray Place 1 spray into both nostrils 2 (two) times daily as needed for allergies.     Marland Kitchen. levothyroxine (SYNTHROID, LEVOTHROID) 100 MCG tablet Take 100 mcg by mouth daily before breakfast.     . Multiple Vitamin (MULTIVITAMIN) tablet Take 1 tablet by mouth every evening.     . Omega-3 Fatty Acids (FISH OIL) 1000 MG CAPS Take 2,000 mg by mouth every evening.     . pantoprazole (PROTONIX) 40 MG tablet Take 40 mg by mouth daily as needed (reflux).     Marland Kitchen. spironolactone (ALDACTONE) 25 MG tablet TAKE 1 TABLET (25 MG TOTAL) BY MOUTH DAILY. 30 tablet 5  . TANZEUM 50 MG PEN Inject 50 mg as directed once a week.  5  . verapamil (CALAN-SR) 180 MG CR tablet TAKE 1 TABLET (180 MG TOTAL) BY MOUTH EVERY MORNING. 90 tablet 0  . warfarin (COUMADIN) 5 MG tablet TAKE AS DIRECTED BY ANTICOAGULATION CLINIC 35 tablet 3   No current facility-administered medications  for this visit.    Allergies:    Allergies  Allergen Reactions  . Oxycodone Hcl Itching    oxycontin    Social History:  The patient  reports that she quit smoking about 15 years ago. She does not have any smokeless tobacco history on file. She reports that she does not drink alcohol or use illicit drugs.   Family History  Problem Relation Age of Onset  . Hypertension Mother   . Heart attack Father   . Heart disease Father   . Hypertension Father     ROS:  Please see the history of present illness.   Denies any bleeding, syncope, orthopnea, PND   All other systems reviewed and negative.   PHYSICAL EXAM: VS:  Ht  (1.702 m)  Wt 253 lb (114.76 kg)  BMI 39.62 kg/m2 Well nourished, well  developed, in no acute distress HEENT: normal, [place]/AT, EOMI Neck: no JVD, normal carotid upstroke, no bruit Cardiac:  normal S1, S2; RRR; soft outflow tract murmur Lungs:  clear to auscultation bilaterally, no wheezing, rhonchi or rales Abd: soft, nontender, no hepatomegaly, no bruits Ext: no edema, 2+ distal pulses Skin: warm and dry GU: deferred Neuro: no focal abnormalities noted, AAO x 3  EKG:  05/09/14 - AFIB/Flutter 56. 06/01/13-atrial fibrillation heart rate 63, LDH, left axis deviation     ECHO: 08/11/10 -  1. Severe asymmetric septal hypertrophy. HOCM. 2. Left ventricular ejection fraction estimated by 2D at 50-55 percent. 3. There were no regional wall motion abnormalities. 4. Moderate left atrial enlargement. 5. Trace mitral valve regurgitation. 6. Trivial tricuspid regurgitation. 7. Mild calcification of the aortic valve. 8. Trace of pulmonic valve regurgitation. 9. Analysis of mitral valve inflow, pulmonary vein Doppler and tissue Doppler suggests grade I diastolic dysfunction without elevated left atrial pressure.s  Holter 7/12 - occasional PVCs, PACs, no atrial fibrillation, no ventricular tachycardia  ASSESSMENT AND PLAN:  1. Atrial fibrillation- now persistent on 100 mg of amiodarone. I decided to increase her amiodarone back to 200 mg once a day. She has hypertrophic cardiomyopathy. We will see her back in 6 weeks. She will likely still be in atrial fibrillation/flutter at that time. We will then schedule cardioversion once again with higher amiodarone maintenance dose if she is still in arrhythmia.  She has been therapeutic on Coumadin. We will check a CBC, basic metabolic profile and PT. I have explained to her the risks and benefits of the procedure including but not limited to stroke. 2. Hypertrophic cardiomyopathy-severe asymmetric septal hypertrophy, ejection fraction 50-55%. Trying to maintain sinus rhythm. 3. Obesity-encourage weight loss. 4. Chronic  anticoagulation- Please see notes 5. See back in 6 weeks.  Reevaluate on amiodarone 200 mg and plan cardioversion if not converted.  Signed, [name], MD [facility]  05/09/2014 3:43 PM

## 2014-05-21 ENCOUNTER — Ambulatory Visit (INDEPENDENT_AMBULATORY_CARE_PROVIDER_SITE_OTHER): Payer: 59

## 2014-05-21 DIAGNOSIS — Z5181 Encounter for therapeutic drug level monitoring: Secondary | ICD-10-CM | POA: Diagnosis not present

## 2014-05-21 DIAGNOSIS — I4891 Unspecified atrial fibrillation: Secondary | ICD-10-CM

## 2014-05-21 LAB — POCT INR: INR: 2.3

## 2014-06-22 ENCOUNTER — Encounter: Payer: Self-pay | Admitting: *Deleted

## 2014-06-22 ENCOUNTER — Ambulatory Visit (INDEPENDENT_AMBULATORY_CARE_PROVIDER_SITE_OTHER): Payer: 59 | Admitting: Cardiology

## 2014-06-22 ENCOUNTER — Encounter: Payer: Self-pay | Admitting: Cardiology

## 2014-06-22 ENCOUNTER — Ambulatory Visit (INDEPENDENT_AMBULATORY_CARE_PROVIDER_SITE_OTHER): Payer: 59 | Admitting: *Deleted

## 2014-06-22 VITALS — BP 100/60 | HR 59 | Ht 67.0 in | Wt 253.4 lb

## 2014-06-22 DIAGNOSIS — Z7901 Long term (current) use of anticoagulants: Secondary | ICD-10-CM | POA: Diagnosis not present

## 2014-06-22 DIAGNOSIS — I480 Paroxysmal atrial fibrillation: Secondary | ICD-10-CM | POA: Diagnosis not present

## 2014-06-22 DIAGNOSIS — Z5181 Encounter for therapeutic drug level monitoring: Secondary | ICD-10-CM

## 2014-06-22 DIAGNOSIS — Z01818 Encounter for other preprocedural examination: Secondary | ICD-10-CM | POA: Diagnosis not present

## 2014-06-22 DIAGNOSIS — I4891 Unspecified atrial fibrillation: Secondary | ICD-10-CM | POA: Diagnosis not present

## 2014-06-22 DIAGNOSIS — I421 Obstructive hypertrophic cardiomyopathy: Secondary | ICD-10-CM | POA: Diagnosis not present

## 2014-06-22 LAB — POCT INR: INR: 3

## 2014-06-22 LAB — CBC
HCT: 44 % (ref 36.0–46.0)
Hemoglobin: 14.2 g/dL (ref 12.0–15.0)
MCHC: 32.3 g/dL (ref 30.0–36.0)
MCV: 91.6 fl (ref 78.0–100.0)
PLATELETS: 414 10*3/uL — AB (ref 150.0–400.0)
RBC: 4.8 Mil/uL (ref 3.87–5.11)
RDW: 14.4 % (ref 11.5–15.5)
WBC: 8.5 10*3/uL (ref 4.0–10.5)

## 2014-06-22 LAB — BASIC METABOLIC PANEL
BUN: 12 mg/dL (ref 6–23)
CALCIUM: 9.6 mg/dL (ref 8.4–10.5)
CO2: 29 meq/L (ref 19–32)
Chloride: 102 mEq/L (ref 96–112)
Creatinine, Ser: 0.99 mg/dL (ref 0.40–1.20)
GFR: 73.71 mL/min (ref >60.00)
GLUCOSE: 101 mg/dL — AB (ref 70–99)
Potassium: 4.5 mEq/L (ref 3.5–5.1)
Sodium: 137 mEq/L (ref 135–145)

## 2014-06-22 NOTE — Progress Notes (Signed)
[address] [place], [place]  [address] Phone: [phone] Fax:  [phone]  Date:  06/22/2014   ID:  Tammy BalsamCynthia D Mckinney, DOB 05/31/1955, MRN [id number]  PCP:  Lolita PatellaEADE,ROBERT ALEXANDER, MD   History of Present Illness: Tammy BalsamCynthia D Mckinney is a 59 y.o. 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  Now in AFIB once again  with last cardioversion 5/15.  At prior visit I increased her amiodarone back to 200 mg. Prior sinus brady. She may feel more shortness of breath. She has not missed her amiodarone. She has been therapeutic on Coumadin. Both verapamil and atenolol have been decreased in the past because of bradycardia. She still maintains atrial fibrillation with slow ventricular response heart rate 52. I would like to try once again since she is on amiodarone 2 get her back to sinus rhythm. Actually she feels better today than she did at last clinic visit so if she reverts back to atrial fibrillation, we may just need to tolerate this and continue with rate control.  She has no early family history of sudden cardiac death, no syncope, no palpitations, no angina. She also has OSA. and was not utilizing her CPAP machine.      Wt Readings from Last 3 Encounters:  05/09/14 253 lb (114.76 kg)  06/01/13 262 lb (118.842 kg)     Past Medical History  Diagnosis Date  . Hypertension   . Dysrhythmia   . Sleep apnea     no cpap  . (HFpEF) heart failure with preserved ejection fraction   . 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 Hypothyroidism   . Diabetes mellitus without complication   . GERD (gastroesophageal reflux disease)     Past Surgical History  Procedure Laterality Date  . Abdominal hysterectomy    . Cholecystectomy    . Thyroidectomy    . Cardioversion N/A 06/09/2013    Procedure: CARDIOVERSION;  Surgeon: Donato SchultzMark  Nemiah Kissner, MD;  [facility];  Service: Cardiovascular;  Laterality: N/A;    Current Outpatient Prescriptions  Medication Sig Dispense Refill  . acetaminophen (TYLENOL) 500 MG tablet Take 1,000 mg by mouth every 6 (six) hours as needed (pain).     Marland Kitchen. amiodarone (PACERONE) 200 MG tablet Take 1 tablet (200 mg total) by mouth daily. 30 tablet 6  . atenolol (TENORMIN) 50 MG tablet Take 0.5 tablets (25 mg total) by mouth daily. 90 tablet 2  . atorvastatin (LIPITOR) 80 MG tablet Take 80 mg by mouth every evening.     . calcitRIOL (ROCALTROL) 0.25 MCG capsule Take 0.75 mcg by mouth every morning.     . fluticasone (FLONASE) 50 MCG/ACT nasal spray Place 1 spray into both nostrils 2 (two) times daily as needed for allergies.     Marland Kitchen. levothyroxine (SYNTHROID, LEVOTHROID) 100 MCG tablet Take 100 mcg by mouth daily before breakfast.     . Multiple Vitamin (MULTIVITAMIN) tablet Take 1 tablet by mouth every evening.     . Omega-3 Fatty Acids (FISH OIL) 1000 MG CAPS Take 2,000 mg by mouth every evening.     . pantoprazole (PROTONIX) 40 MG tablet Take 40 mg by mouth daily as needed (reflux).     Marland Kitchen. spironolactone (ALDACTONE) 25 MG tablet TAKE 1 TABLET (25 MG TOTAL) BY MOUTH DAILY. 30 tablet 5  . TANZEUM 50 MG PEN Inject  50 mg as directed once a week.  5  . verapamil (CALAN-SR) 180 MG CR tablet TAKE 1 TABLET (180 MG TOTAL) BY MOUTH EVERY MORNING. 90 tablet 0  . warfarin (COUMADIN) 5 MG tablet TAKE AS DIRECTED BY ANTICOAGULATION CLINIC 35 tablet 3   No current facility-administered medications for this visit.    Allergies:    Allergies  Allergen Reactions  . Oxycodone Hcl Itching    oxycontin    Social History:  The patient  reports that she quit smoking about 16 years ago. She does not have any smokeless tobacco history on file. She reports that she does not drink alcohol or use illicit drugs.   Family History  Problem Relation Age of Onset  . Hypertension Mother   . Heart attack Father   .  Heart disease Father   . Hypertension Father     ROS:  Please see the history of present illness.   Denies any bleeding, syncope, orthopnea, PND   All other systems reviewed and negative.   PHYSICAL EXAM: VS:  There were no vitals taken for this visit. Well nourished, well developed, in no acute distress HEENT: normal, [place]/AT, EOMI Neck: no JVD, normal carotid upstroke, no bruit Cardiac:  normal S1, S2; RRR; soft outflow tract murmur Lungs:  clear to auscultation bilaterally, no wheezing, rhonchi or rales Abd: soft, nontender, no hepatomegaly, no bruits Ext: no edema, 2+ distal pulses Skin: warm and dry GU: deferred Neuro: no focal abnormalities noted, AAO x 3  EKG: Today 06/22/14 05/09/14 - AFIB/Flutter 56. 06/01/13-atrial fibrillation heart rate 63, LDH, left axis deviation     ECHO: 08/11/10 -  1. Severe asymmetric septal hypertrophy. HOCM. 2. Left ventricular ejection fraction estimated by 2D at 50-55 percent. 3. There were no regional wall motion abnormalities. 4. Moderate left atrial enlargement. 5. Trace mitral valve regurgitation. 6. Trivial tricuspid regurgitation. 7. Mild calcification of the aortic valve. 8. Trace of pulmonic valve regurgitation. 9. Analysis of mitral valve inflow, pulmonary vein Doppler and tissue Doppler suggests grade I diastolic dysfunction without elevated left atrial pressure.s  Holter 7/12 - occasional PVCs, PACs, no atrial fibrillation, no ventricular tachycardia  ASSESSMENT AND PLAN:  1. Atrial fibrillation- amiodarone back to 200 mg once a day. She has hypertrophic cardiomyopathy. Cardioversion once again with higher amiodarone maintenance dose. Risks and benefits. She has been therapeutic on Coumadin. We will check a CBC, basic metabolic profile and PT. I have explained to her the risks and benefits of the procedure including but not limited to stroke. I will also decrease her atenolol from 25 mg to discontinuation. Heart rate currently 52  bpm. 2. Hypertrophic cardiomyopathy-severe asymmetric septal hypertrophy, ejection fraction 50-55%. Trying to maintain sinus rhythm. 3. Obesity-encourage weight loss. 4. Chronic anticoagulation- Please see notes 5. We will see back 2 weeks post cardioversion  Signed, [name], MD [facility]  06/22/2014 1:36 PM

## 2014-06-22 NOTE — Patient Instructions (Addendum)
Medication Instructions:  Please stop your Atenolol. Continue all other medications as listed.  Labwork: Please have blood work today (CBC,BMP)  Testing/Procedures: Your physician has requested that you have a Cardioversion.  Electrical Cardioversion uses a jolt of electricity to your heart either through paddles or wired patches attached to your chest. This is a controlled, usually prescheduled, procedure. This procedure is done at the hospital and you are not awake during the procedure. You usually go home the day of the procedure. Please see the instruction sheet given to you today for more information.  Follow-Up: After cardioversion  Thank you for choosing [facility]!!

## 2014-06-28 ENCOUNTER — Ambulatory Visit (HOSPITAL_COMMUNITY): Payer: 59 | Admitting: Certified Registered Nurse Anesthetist

## 2014-06-28 ENCOUNTER — Encounter (HOSPITAL_COMMUNITY)
Admission: RE | Disposition: A | Discharge status: Home / Self Care | Payer: 59 | Source: Ambulatory Visit | Attending: Cardiology

## 2014-06-28 ENCOUNTER — Encounter (HOSPITAL_COMMUNITY): Payer: Self-pay | Admitting: *Deleted

## 2014-06-28 ENCOUNTER — Ambulatory Visit (HOSPITAL_COMMUNITY)
Admission: RE | Admit: 2014-06-28 | Discharge: 2014-06-28 | Disposition: A | Discharge status: Home / Self Care | Payer: 59 | Source: Ambulatory Visit | Attending: Cardiology | Admitting: Cardiology

## 2014-06-28 DIAGNOSIS — E039 Hypothyroidism, unspecified: Secondary | ICD-10-CM | POA: Diagnosis not present

## 2014-06-28 DIAGNOSIS — K219 Gastro-esophageal reflux disease without esophagitis: Secondary | ICD-10-CM | POA: Insufficient documentation

## 2014-06-28 DIAGNOSIS — I509 Heart failure, unspecified: Secondary | ICD-10-CM | POA: Insufficient documentation

## 2014-06-28 DIAGNOSIS — I421 Obstructive hypertrophic cardiomyopathy: Secondary | ICD-10-CM | POA: Diagnosis not present

## 2014-06-28 DIAGNOSIS — E119 Type 2 diabetes mellitus without complications: Secondary | ICD-10-CM | POA: Diagnosis not present

## 2014-06-28 DIAGNOSIS — G4733 Obstructive sleep apnea (adult) (pediatric): Secondary | ICD-10-CM | POA: Insufficient documentation

## 2014-06-28 DIAGNOSIS — Z7901 Long term (current) use of anticoagulants: Secondary | ICD-10-CM | POA: Insufficient documentation

## 2014-06-28 DIAGNOSIS — Z79899 Other long term (current) drug therapy: Secondary | ICD-10-CM | POA: Diagnosis not present

## 2014-06-28 DIAGNOSIS — Z9889 Other specified postprocedural states: Secondary | ICD-10-CM | POA: Diagnosis not present

## 2014-06-28 DIAGNOSIS — I480 Paroxysmal atrial fibrillation: Secondary | ICD-10-CM

## 2014-06-28 DIAGNOSIS — I4891 Unspecified atrial fibrillation: Secondary | ICD-10-CM | POA: Insufficient documentation

## 2014-06-28 DIAGNOSIS — I10 Essential (primary) hypertension: Secondary | ICD-10-CM | POA: Insufficient documentation

## 2014-06-28 DIAGNOSIS — Z87891 Personal history of nicotine dependence: Secondary | ICD-10-CM | POA: Insufficient documentation

## 2014-06-28 HISTORY — PX: CARDIOVERSION: SHX1299

## 2014-06-28 LAB — PROTIME-INR
INR: 3.39 — ABNORMAL HIGH (ref 0.00–1.49)
PROTHROMBIN TIME: 33.6 s — AB (ref 11.6–15.2)

## 2014-06-28 LAB — GLUCOSE, CAPILLARY: Glucose-Capillary: 91 mg/dL (ref 65–99)

## 2014-06-28 SURGERY — CARDIOVERSION
Anesthesia: General

## 2014-06-28 MED ORDER — LIDOCAINE HCL (CARDIAC) 20 MG/ML IV SOLN
INTRAVENOUS | Status: DC | PRN
  Administered 2014-06-28: 60 mL via INTRAVENOUS

## 2014-06-28 MED ORDER — PROPOFOL 10 MG/ML IV BOLUS
INTRAVENOUS | Status: DC | PRN
  Administered 2014-06-28: 100 mg via INTRAVENOUS

## 2014-06-28 MED ORDER — SODIUM CHLORIDE 0.9 % IV SOLN
INTRAVENOUS | Status: DC
  Administered 2014-06-28: 13:00:00 via INTRAVENOUS

## 2014-06-28 NOTE — Transfer of Care (Signed)
Immediate Anesthesia Transfer of Care Note  Patient: Tammy Mckinney  Procedure(s) Performed: Procedure(s): CARDIOVERSION (N/A)  Patient Location: PACU and Endoscopy Unit  Anesthesia Type:General  Level of Consciousness: patient cooperative and responds to stimulation  Airway & Oxygen Therapy: Patient Spontanous Breathing and Patient connected to nasal cannula oxygen  Post-op Assessment: Report given to RN and Post -op Vital signs reviewed and stable  Post vital signs: Reviewed and stable  Last Vitals:  Filed Vitals:   06/28/14 1340  BP: 112/66  Pulse: 59  Temp:   Resp: 16    Complications: No apparent anesthesia complications

## 2014-06-28 NOTE — Discharge Instructions (Signed)

## 2014-06-28 NOTE — H&P (View-Only) (Signed)
    [address] [place], [place]  [address] Phone: [phone] Fax:  [phone]  Date:  06/22/2014   ID:  Tammy Mckinney, DOB 04/29/1955, MRN [id number]  PCP:  READE,ROBERT ALEXANDER, MD   History of Present Illness: Tammy Mckinney is a 58 y.o. 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  Now in AFIB once again  with last cardioversion 5/15.  At prior visit I increased her amiodarone back to 200 mg. Prior sinus brady. She may feel more shortness of breath. She has not missed her amiodarone. She has been therapeutic on Coumadin. Both verapamil and atenolol have been decreased in the past because of bradycardia. She still maintains atrial fibrillation with slow ventricular response heart rate 52. I would like to try once again since she is on amiodarone 2 get her back to sinus rhythm. Actually she feels better today than she did at last clinic visit so if she reverts back to atrial fibrillation, we may just need to tolerate this and continue with rate control.  She has no early family history of sudden cardiac death, no syncope, no palpitations, no angina. She also has OSA. and was not utilizing her CPAP machine.      Wt Readings from Last 3 Encounters:  05/09/14 253 lb (114.76 kg)  06/01/13 262 lb (118.842 kg)     Past Medical History  Diagnosis Date  . Hypertension   . Dysrhythmia   . Sleep apnea     no cpap  . (HFpEF) heart failure with preserved ejection fraction   . 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 Hypothyroidism   . Diabetes mellitus without complication   . GERD (gastroesophageal reflux disease)     Past Surgical History  Procedure Laterality Date  . Abdominal hysterectomy    . Cholecystectomy    . Thyroidectomy    . Cardioversion N/A 06/09/2013    Procedure: CARDIOVERSION;  Surgeon: Jimya Ciani, MD;  Location: [facility] ENDOSCOPY;  Service: Cardiovascular;  Laterality: N/A;    Current Outpatient Prescriptions  Medication Sig Dispense Refill  . acetaminophen (TYLENOL) 500 MG tablet Take 1,000 mg by mouth every 6 (six) hours as needed (pain).     . amiodarone (PACERONE) 200 MG tablet Take 1 tablet (200 mg total) by mouth daily. 30 tablet 6  . atenolol (TENORMIN) 50 MG tablet Take 0.5 tablets (25 mg total) by mouth daily. 90 tablet 2  . atorvastatin (LIPITOR) 80 MG tablet Take 80 mg by mouth every evening.     . calcitRIOL (ROCALTROL) 0.25 MCG capsule Take 0.75 mcg by mouth every morning.     . fluticasone (FLONASE) 50 MCG/ACT nasal spray Place 1 spray into both nostrils 2 (two) times daily as needed for allergies.     . levothyroxine (SYNTHROID, LEVOTHROID) 100 MCG tablet Take 100 mcg by mouth daily before breakfast.     . Multiple Vitamin (MULTIVITAMIN) tablet Take 1 tablet by mouth every evening.     . Omega-3 Fatty Acids (FISH OIL) 1000 MG CAPS Take 2,000 mg by mouth every evening.     . pantoprazole (PROTONIX) 40 MG tablet Take 40 mg by mouth daily as needed (reflux).     . spironolactone (ALDACTONE) 25 MG tablet TAKE 1 TABLET (25 MG TOTAL) BY MOUTH DAILY. 30 tablet 5  . TANZEUM 50 MG PEN Inject   50 mg as directed once a week.  5  . verapamil (CALAN-SR) 180 MG CR tablet TAKE 1 TABLET (180 MG TOTAL) BY MOUTH EVERY MORNING. 90 tablet 0  . warfarin (COUMADIN) 5 MG tablet TAKE AS DIRECTED BY ANTICOAGULATION CLINIC 35 tablet 3   No current facility-administered medications for this visit.    Allergies:    Allergies  Allergen Reactions  . Oxycodone Hcl Itching    oxycontin    Social History:  The patient  reports that she quit smoking about 16 years ago. She does not have any smokeless tobacco history on file. She reports that she does not drink alcohol or use illicit drugs.   Family History  Problem Relation Age of Onset  . Hypertension Mother   . Heart attack Father   .  Heart disease Father   . Hypertension Father     ROS:  Please see the history of present illness.   Denies any bleeding, syncope, orthopnea, PND   All other systems reviewed and negative.   PHYSICAL EXAM: VS:  There were no vitals taken for this visit. Well nourished, well developed, in no acute distress HEENT: normal, [place]/AT, EOMI Neck: no JVD, normal carotid upstroke, no bruit Cardiac:  normal S1, S2; RRR; soft outflow tract murmur Lungs:  clear to auscultation bilaterally, no wheezing, rhonchi or rales Abd: soft, nontender, no hepatomegaly, no bruits Ext: no edema, 2+ distal pulses Skin: warm and dry GU: deferred Neuro: no focal abnormalities noted, AAO x 3  EKG: Today 06/22/14 05/09/14 - AFIB/Flutter 56. 06/01/13-atrial fibrillation heart rate 63, LDH, left axis deviation     ECHO: 08/11/10 -  1. Severe asymmetric septal hypertrophy. HOCM. 2. Left ventricular ejection fraction estimated by 2D at 50-55 percent. 3. There were no regional wall motion abnormalities. 4. Moderate left atrial enlargement. 5. Trace mitral valve regurgitation. 6. Trivial tricuspid regurgitation. 7. Mild calcification of the aortic valve. 8. Trace of pulmonic valve regurgitation. 9. Analysis of mitral valve inflow, pulmonary vein Doppler and tissue Doppler suggests grade I diastolic dysfunction without elevated left atrial pressure.s  Holter 7/12 - occasional PVCs, PACs, no atrial fibrillation, no ventricular tachycardia  ASSESSMENT AND PLAN:  1. Atrial fibrillation- amiodarone back to 200 mg once a day. She has hypertrophic cardiomyopathy. Cardioversion once again with higher amiodarone maintenance dose. Risks and benefits. She has been therapeutic on Coumadin. We will check a CBC, basic metabolic profile and PT. I have explained to her the risks and benefits of the procedure including but not limited to stroke. I will also decrease her atenolol from 25 mg to discontinuation. Heart rate currently 52  bpm. 2. Hypertrophic cardiomyopathy-severe asymmetric septal hypertrophy, ejection fraction 50-55%. Trying to maintain sinus rhythm. 3. Obesity-encourage weight loss. 4. Chronic anticoagulation- Please see notes 5. We will see back 2 weeks post cardioversion  Signed, Rushil Kimbrell, MD FACC  06/22/2014 1:36 PM     

## 2014-06-28 NOTE — Anesthesia Postprocedure Evaluation (Signed)
  Anesthesia Post-op Note  Patient: Tammy Mckinney  Procedure(s) Performed: Procedure(s): CARDIOVERSION (N/A)  Patient Location: PACU  Anesthesia Type:General  Level of Consciousness: awake and alert   Airway and Oxygen Therapy: Patient Spontanous Breathing  Post-op Pain: none  Post-op Assessment: Post-op Vital signs reviewed              Post-op Vital Signs: Reviewed  Last Vitals:  Filed Vitals:   06/28/14 1405  BP: 117/78  Pulse: 62  Temp:   Resp: 18    Complications: No apparent anesthesia complications

## 2014-06-28 NOTE — CV Procedure (Signed)
    Electrical Cardioversion Procedure Note Tammy Mckinney [id number] May 08, 1955  Procedure: Electrical Cardioversion Indications:  Atrial Fibrillation  Time Out: Verified patient identification, verified procedure,medications/allergies/relevent history reviewed, required imaging and test results available.  Performed  Procedure Details  The patient was NPO after midnight. Anesthesia was administered at the beside  by Dr. Casilda Carls with propofol.  Cardioversion was performed with synchronized biphasic defibrillation via AP pads with 150 joules.  1 attempt(s) were performed.  The patient converted to normal sinus rhythm. The patient tolerated the procedure well   IMPRESSION:  Successful cardioversion of atrial fibrillation. Continue AMIO HCM    Tammy Mckinney 06/28/2014, 1:47 PM

## 2014-06-28 NOTE — Anesthesia Preprocedure Evaluation (Signed)
Anesthesia Evaluation  Patient identified by MRN, date of birth, ID band Patient awake    Reviewed: Allergy & Precautions, H&P , NPO status , Patient's Chart, lab work & pertinent test results  Airway Mallampati: II  TM Distance: >3 FB Neck ROM: full    Dental   Pulmonary sleep apnea , former smoker,  breath sounds clear to auscultation        Cardiovascular hypertension, Pt. on medications + dysrhythmias Atrial Fibrillation Rhythm:Irregular Rate:Normal  HOCM with preserved EF. Outflow tract obstruction.   Neuro/Psych negative neurological ROS     GI/Hepatic Neg liver ROS, GERD-  ,  Endo/Other  diabetes, Type 2Hypothyroidism Morbid obesity  Renal/GU negative Renal ROS     Musculoskeletal   Abdominal   Peds  Hematology negative hematology ROS (+)   Anesthesia Other Findings   Reproductive/Obstetrics                             Anesthesia Physical  Anesthesia Plan  ASA: III  Anesthesia Plan: General   Post-op Pain Management:    Induction: Intravenous  Airway Management Planned: Mask and Natural Airway  Additional Equipment:   Intra-op Plan:   Post-operative Plan:   Informed Consent: I have reviewed the patients History and Physical, chart, labs and discussed the procedure including the risks, benefits and alternatives for the proposed anesthesia with the patient or authorized representative who has indicated his/her understanding and acceptance.     Plan Discussed with: CRNA  Anesthesia Plan Comments:         Anesthesia Quick Evaluation

## 2014-06-28 NOTE — Interval H&P Note (Signed)
History and Physical Interval Note:  06/28/2014 1:26 PM  Tammy Mckinney  has presented today for surgery, with the diagnosis of AFIB  The various methods of treatment have been discussed with the patient and family. After consideration of risks, benefits and other options for treatment, the patient has consented to  Procedure(s): CARDIOVERSION (N/A) as a surgical intervention .  The patient's history has been reviewed, patient examined, no change in status, stable for surgery.  I have reviewed the patient's chart and labs.  Questions were answered to the patient's satisfaction.     [name]

## 2014-06-29 ENCOUNTER — Encounter (HOSPITAL_COMMUNITY): Payer: Self-pay | Admitting: Cardiology

## 2014-07-02 ENCOUNTER — Other Ambulatory Visit: Payer: Self-pay | Admitting: Cardiology

## 2014-07-05 ENCOUNTER — Ambulatory Visit (INDEPENDENT_AMBULATORY_CARE_PROVIDER_SITE_OTHER): Payer: 59

## 2014-07-05 DIAGNOSIS — I4891 Unspecified atrial fibrillation: Secondary | ICD-10-CM

## 2014-07-05 DIAGNOSIS — I480 Paroxysmal atrial fibrillation: Secondary | ICD-10-CM

## 2014-07-05 DIAGNOSIS — Z5181 Encounter for therapeutic drug level monitoring: Secondary | ICD-10-CM

## 2014-07-05 LAB — POCT INR: INR: 3.8

## 2014-07-17 ENCOUNTER — Encounter: Payer: Self-pay | Admitting: Cardiology

## 2014-07-17 ENCOUNTER — Ambulatory Visit (INDEPENDENT_AMBULATORY_CARE_PROVIDER_SITE_OTHER): Payer: 59 | Admitting: Cardiology

## 2014-07-17 ENCOUNTER — Ambulatory Visit (INDEPENDENT_AMBULATORY_CARE_PROVIDER_SITE_OTHER): Payer: 59 | Admitting: *Deleted

## 2014-07-17 VITALS — BP 112/60 | HR 59 | Ht 67.0 in | Wt 255.0 lb

## 2014-07-17 DIAGNOSIS — Z5181 Encounter for therapeutic drug level monitoring: Secondary | ICD-10-CM

## 2014-07-17 DIAGNOSIS — I480 Paroxysmal atrial fibrillation: Secondary | ICD-10-CM

## 2014-07-17 DIAGNOSIS — Z7901 Long term (current) use of anticoagulants: Secondary | ICD-10-CM

## 2014-07-17 DIAGNOSIS — I421 Obstructive hypertrophic cardiomyopathy: Secondary | ICD-10-CM | POA: Diagnosis not present

## 2014-07-17 DIAGNOSIS — I4891 Unspecified atrial fibrillation: Secondary | ICD-10-CM

## 2014-07-17 LAB — POCT INR: INR: 2.1

## 2014-07-17 NOTE — Patient Instructions (Signed)
Medication Instructions:  The current medical regimen is effective;  continue present plan and medications.  Follow-Up: Follow up in 6 months with Dr. Skains.  You will receive a letter in the mail 2 months before you are due.  Please call us when you receive this letter to schedule your follow up appointment.  Thank you for choosing [facility]!!     

## 2014-07-17 NOTE — Progress Notes (Signed)
[address] [place], [place]  [address] Phone: [phone](336) [phone] Fax:  [phone](336) [phone]  Date:  07/17/2014   ID:  Tammy BalsamCynthia D Mckinney, DOB 10/07/1955, MRN [id number]  PCP:  Lolita PatellaEADE,ROBERT ALEXANDER, MD   History of Present Illness: Tammy Mckinney is a 59 y.o. 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  She had cardioversion in June 2016. Amiodarone 200 mg. Doing well currently. She feels better in sinus rhythm.  Previous cardioversion 5/15.   She has no early family history of sudden cardiac death, no syncope, no palpitations, no angina. She also has OSA. and was not utilizing her CPAP machine.      Wt Readings from Last 3 Encounters:  07/17/14 255 lb (115.667 kg)  06/22/14 253 lb 6.4 oz (114.941 kg)  05/09/14 253 lb (114.76 kg)     Past Medical History  Diagnosis Date  . Hypertension   . Dysrhythmia   . Sleep apnea     no cpap  . (HFpEF) heart failure with preserved ejection fraction   . 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 Hypothyroidism   . Diabetes mellitus without complication   . GERD (gastroesophageal reflux disease)     Past Surgical History  Procedure Laterality Date  . Abdominal hysterectomy    . Cholecystectomy    . Thyroidectomy    . Cardioversion N/A 06/09/2013    Procedure: CARDIOVERSION;  Surgeon: Donato SchultzMark Skains, MD;  [facility];  Service: Cardiovascular;  Laterality: N/A;  . Cardioversion N/A 06/28/2014    Procedure: CARDIOVERSION;  Surgeon: Jake BatheMark C Skains, MD;  [facility];  Service: Cardiovascular;  Laterality: N/A;    Current Outpatient Prescriptions  Medication Sig Dispense Refill  . acetaminophen (TYLENOL) 500 MG tablet Take 1,000 mg by mouth every 6 (six) hours as needed (pain).     Marland Kitchen. amiodarone (PACERONE) 200 MG tablet Take 1 tablet (200 mg total) by mouth daily. 30 tablet  6  . atorvastatin (LIPITOR) 80 MG tablet Take 80 mg by mouth every evening.     . calcitRIOL (ROCALTROL) 0.25 MCG capsule Take 0.75 mcg by mouth every morning.     . fluticasone (FLONASE) 50 MCG/ACT nasal spray Place 1 spray into both nostrils 2 (two) times daily as needed for allergies.     Marland Kitchen. levothyroxine (SYNTHROID, LEVOTHROID) 100 MCG tablet Take 100 mcg by mouth daily before breakfast.     . Multiple Vitamin (MULTIVITAMIN) tablet Take 1 tablet by mouth every evening.     . Omega-3 Fatty Acids (FISH OIL) 1000 MG CAPS Take 2,000 mg by mouth every evening.     . pantoprazole (PROTONIX) 40 MG tablet Take 40 mg by mouth daily as needed (reflux).     Marland Kitchen. spironolactone (ALDACTONE) 25 MG tablet TAKE 1 TABLET (25 MG TOTAL) BY MOUTH DAILY. 30 tablet 5  . TANZEUM 50 MG PEN Inject 50 mg as directed once a week.  5  . verapamil (CALAN-SR) 180 MG CR tablet TAKE 1 TABLET (180 MG TOTAL) BY MOUTH EVERY MORNING. 90 tablet 0  . warfarin (COUMADIN) 5 MG tablet TAKE AS DIRECTED BY ANTICOAGULATION CLINIC (Patient taking differently: Taking 5 mg every day except Tuesday takes 7.5 mg) 35 tablet 3   No current facility-administered medications for this visit.    Allergies:    Allergies  Allergen Reactions  . Oxycodone Hcl  Itching    oxycontin    Social History:  The patient  reports that she quit smoking about 16 years ago. She does not have any smokeless tobacco history on file. She reports that she does not drink alcohol or use illicit drugs.   Family History  Problem Relation Age of Onset  . Hypertension Mother   . Heart attack Father   . Heart disease Father   . Hypertension Father   . Stroke Father     ROS:  Please see the history of present illness.   Denies any bleeding, syncope, orthopnea, PND   All other systems reviewed and negative. 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  PHYSICAL EXAM: VS:  BP 112/60 mmHg  Pulse 59  Ht  (1.702 m)  Wt 255 lb (115.667 kg)  BMI 39.93 kg/m2 Well  nourished, well developed, in no acute distress HEENT: normal, [place]/AT, EOMI Neck: no JVD, normal carotid upstroke, no bruit Cardiac:  normal S1, S2; RRR; soft outflow tract murmur Lungs:  clear to auscultation bilaterally, no wheezing, rhonchi or rales Abd: soft, nontender, no hepatomegaly, no bruits Ext: no edema, 2+ distal pulses Skin: warm and dry GU: deferred Neuro: no focal abnormalities noted, AAO x 3  EKG: Today 07/17/14-sinus bradycardia with first-degree AV block 270 ms with PACs, nonspecific intraventricular block. Previous 06/22/14 05/09/14 - AFIB/Flutter 56. 06/01/13-atrial fibrillation heart rate 63, LDH, left axis deviation     ECHO: 08/11/10 -  1. Severe asymmetric septal hypertrophy. HOCM. 2. Left ventricular ejection fraction estimated by 2D at 50-55 percent. 3. There were no regional wall motion abnormalities. 4. Moderate left atrial enlargement. 5. Trace mitral valve regurgitation. 6. Trivial tricuspid regurgitation. 7. Mild calcification of the aortic valve. 8. Trace of pulmonic valve regurgitation. 9. Analysis of mitral valve inflow, pulmonary vein Doppler and tissue Doppler suggests grade I diastolic dysfunction without elevated left atrial pressure.s  Holter 7/12 - occasional PVCs, PACs, no atrial fibrillation, no ventricular tachycardia  ASSESSMENT AND PLAN:  1. Atrial fibrillation- amiodarone back to 200 mg once a day. She has hypertrophic cardiomyopathy. Feels less fatigue. Less SOB after cardioversion on 06/28/14. Continue with verapamil. 2. First-degree AV block. 3. Hypertrophic cardiomyopathy-severe asymmetric septal hypertrophy, ejection fraction 50-55%. Trying to maintain sinus rhythm. She feels better in normal rhythm. 4. Obesity-encourage weight loss. 5. Chronic anticoagulation- Please see notes 6. We will see back 6 months  Signed, [name], MD [facility]  07/17/2014 4:32 PM

## 2014-08-09 ENCOUNTER — Other Ambulatory Visit: Payer: Self-pay | Admitting: Cardiology

## 2014-08-09 NOTE — Telephone Encounter (Signed)
COUMADIN REFILL. THANK YOUR FOR YOUR TIME. 

## 2014-08-10 ENCOUNTER — Ambulatory Visit (INDEPENDENT_AMBULATORY_CARE_PROVIDER_SITE_OTHER): Payer: 59

## 2014-08-10 DIAGNOSIS — I4891 Unspecified atrial fibrillation: Secondary | ICD-10-CM

## 2014-08-10 DIAGNOSIS — I480 Paroxysmal atrial fibrillation: Secondary | ICD-10-CM

## 2014-08-10 DIAGNOSIS — Z5181 Encounter for therapeutic drug level monitoring: Secondary | ICD-10-CM

## 2014-08-10 LAB — POCT INR: INR: 2.9

## 2014-08-27 ENCOUNTER — Other Ambulatory Visit: Payer: Self-pay | Admitting: *Deleted

## 2014-08-27 DIAGNOSIS — I480 Paroxysmal atrial fibrillation: Secondary | ICD-10-CM

## 2014-08-27 MED ORDER — AMIODARONE HCL 200 MG PO TABS: 200.0000 mg | ORAL_TABLET | Freq: Every day | ORAL | Status: DC

## 2014-09-14 ENCOUNTER — Ambulatory Visit (INDEPENDENT_AMBULATORY_CARE_PROVIDER_SITE_OTHER): Payer: 59 | Admitting: *Deleted

## 2014-09-14 DIAGNOSIS — I4891 Unspecified atrial fibrillation: Secondary | ICD-10-CM

## 2014-09-14 DIAGNOSIS — I480 Paroxysmal atrial fibrillation: Secondary | ICD-10-CM

## 2014-09-14 DIAGNOSIS — Z5181 Encounter for therapeutic drug level monitoring: Secondary | ICD-10-CM

## 2014-09-14 LAB — POCT INR: INR: 2.9

## 2014-10-19 ENCOUNTER — Ambulatory Visit (INDEPENDENT_AMBULATORY_CARE_PROVIDER_SITE_OTHER): Payer: 59 | Admitting: *Deleted

## 2014-10-19 DIAGNOSIS — Z5181 Encounter for therapeutic drug level monitoring: Secondary | ICD-10-CM | POA: Diagnosis not present

## 2014-10-19 DIAGNOSIS — I480 Paroxysmal atrial fibrillation: Secondary | ICD-10-CM | POA: Diagnosis not present

## 2014-10-19 DIAGNOSIS — I4891 Unspecified atrial fibrillation: Secondary | ICD-10-CM

## 2014-10-19 LAB — POCT INR: INR: 1.5

## 2014-11-02 ENCOUNTER — Ambulatory Visit (INDEPENDENT_AMBULATORY_CARE_PROVIDER_SITE_OTHER): Payer: 59 | Admitting: *Deleted

## 2014-11-02 DIAGNOSIS — I480 Paroxysmal atrial fibrillation: Secondary | ICD-10-CM | POA: Diagnosis not present

## 2014-11-02 DIAGNOSIS — Z5181 Encounter for therapeutic drug level monitoring: Secondary | ICD-10-CM | POA: Diagnosis not present

## 2014-11-02 DIAGNOSIS — I4891 Unspecified atrial fibrillation: Secondary | ICD-10-CM

## 2014-11-02 LAB — POCT INR: INR: 3.5

## 2014-11-16 ENCOUNTER — Ambulatory Visit (INDEPENDENT_AMBULATORY_CARE_PROVIDER_SITE_OTHER): Payer: 59 | Admitting: *Deleted

## 2014-11-16 DIAGNOSIS — I480 Paroxysmal atrial fibrillation: Secondary | ICD-10-CM | POA: Diagnosis not present

## 2014-11-16 DIAGNOSIS — I4891 Unspecified atrial fibrillation: Secondary | ICD-10-CM | POA: Diagnosis not present

## 2014-11-16 DIAGNOSIS — Z5181 Encounter for therapeutic drug level monitoring: Secondary | ICD-10-CM | POA: Diagnosis not present

## 2014-11-16 LAB — POCT INR: INR: 2.7

## 2014-12-03 ENCOUNTER — Other Ambulatory Visit: Payer: Self-pay | Admitting: Cardiology

## 2014-12-06 ENCOUNTER — Telehealth: Payer: Self-pay | Admitting: *Deleted

## 2014-12-06 NOTE — Telephone Encounter (Signed)
Pt called stating that she has just seen PCP and has been ordered Flexeril and Protonix has been increased and she states she just does not feel like coming for her appt on tomorrow so appt made for Monday at 4pm and she states understanding

## 2014-12-10 ENCOUNTER — Ambulatory Visit (INDEPENDENT_AMBULATORY_CARE_PROVIDER_SITE_OTHER): Payer: 59 | Admitting: *Deleted

## 2014-12-10 DIAGNOSIS — I4891 Unspecified atrial fibrillation: Secondary | ICD-10-CM

## 2014-12-10 DIAGNOSIS — Z5181 Encounter for therapeutic drug level monitoring: Secondary | ICD-10-CM | POA: Diagnosis not present

## 2014-12-10 DIAGNOSIS — I480 Paroxysmal atrial fibrillation: Secondary | ICD-10-CM | POA: Diagnosis not present

## 2014-12-10 LAB — POCT INR: INR: 2.7

## 2014-12-22 IMAGING — CR DG CHEST 2V
3 series · 3 of 3 positions shown · non-contrast
Comparison: 05/05/2006.

CLINICAL DATA: Sharp chest pain.

EXAM:
CHEST  2 VIEW

[view not recorded (1 of 3)]
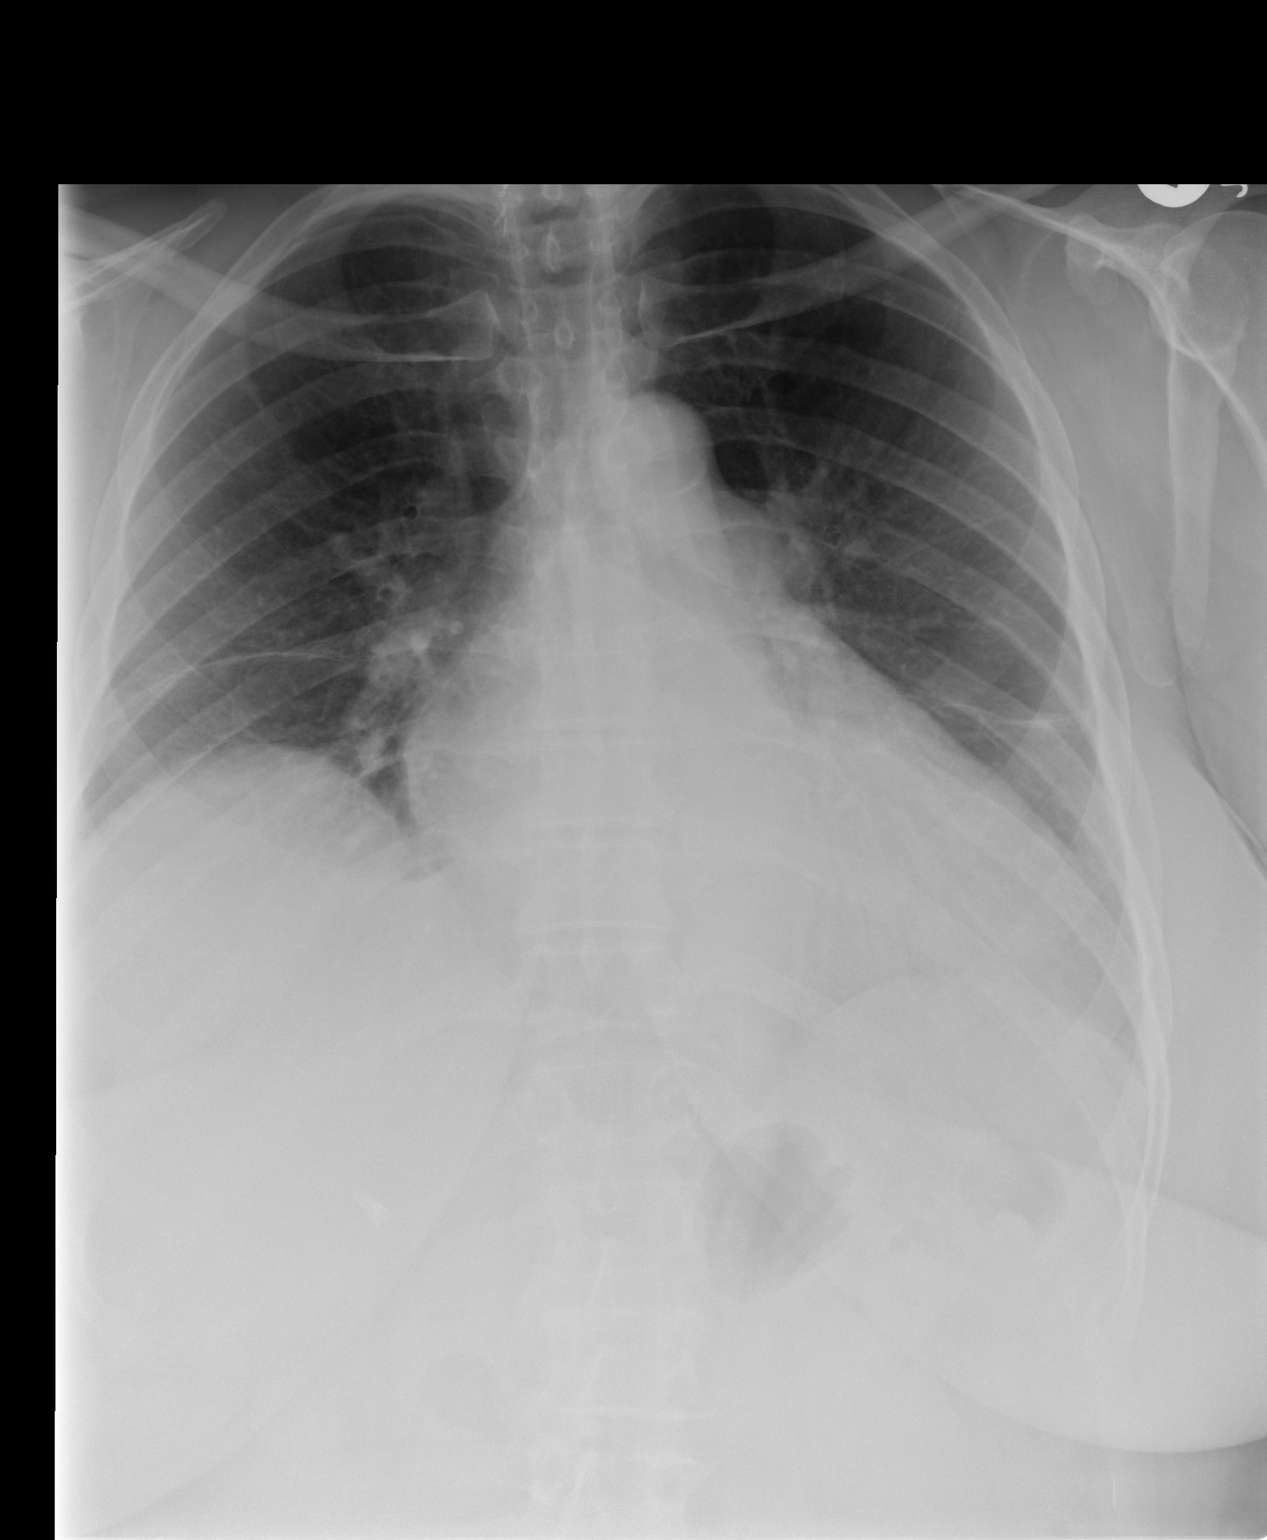

[view not recorded (2 of 3)]
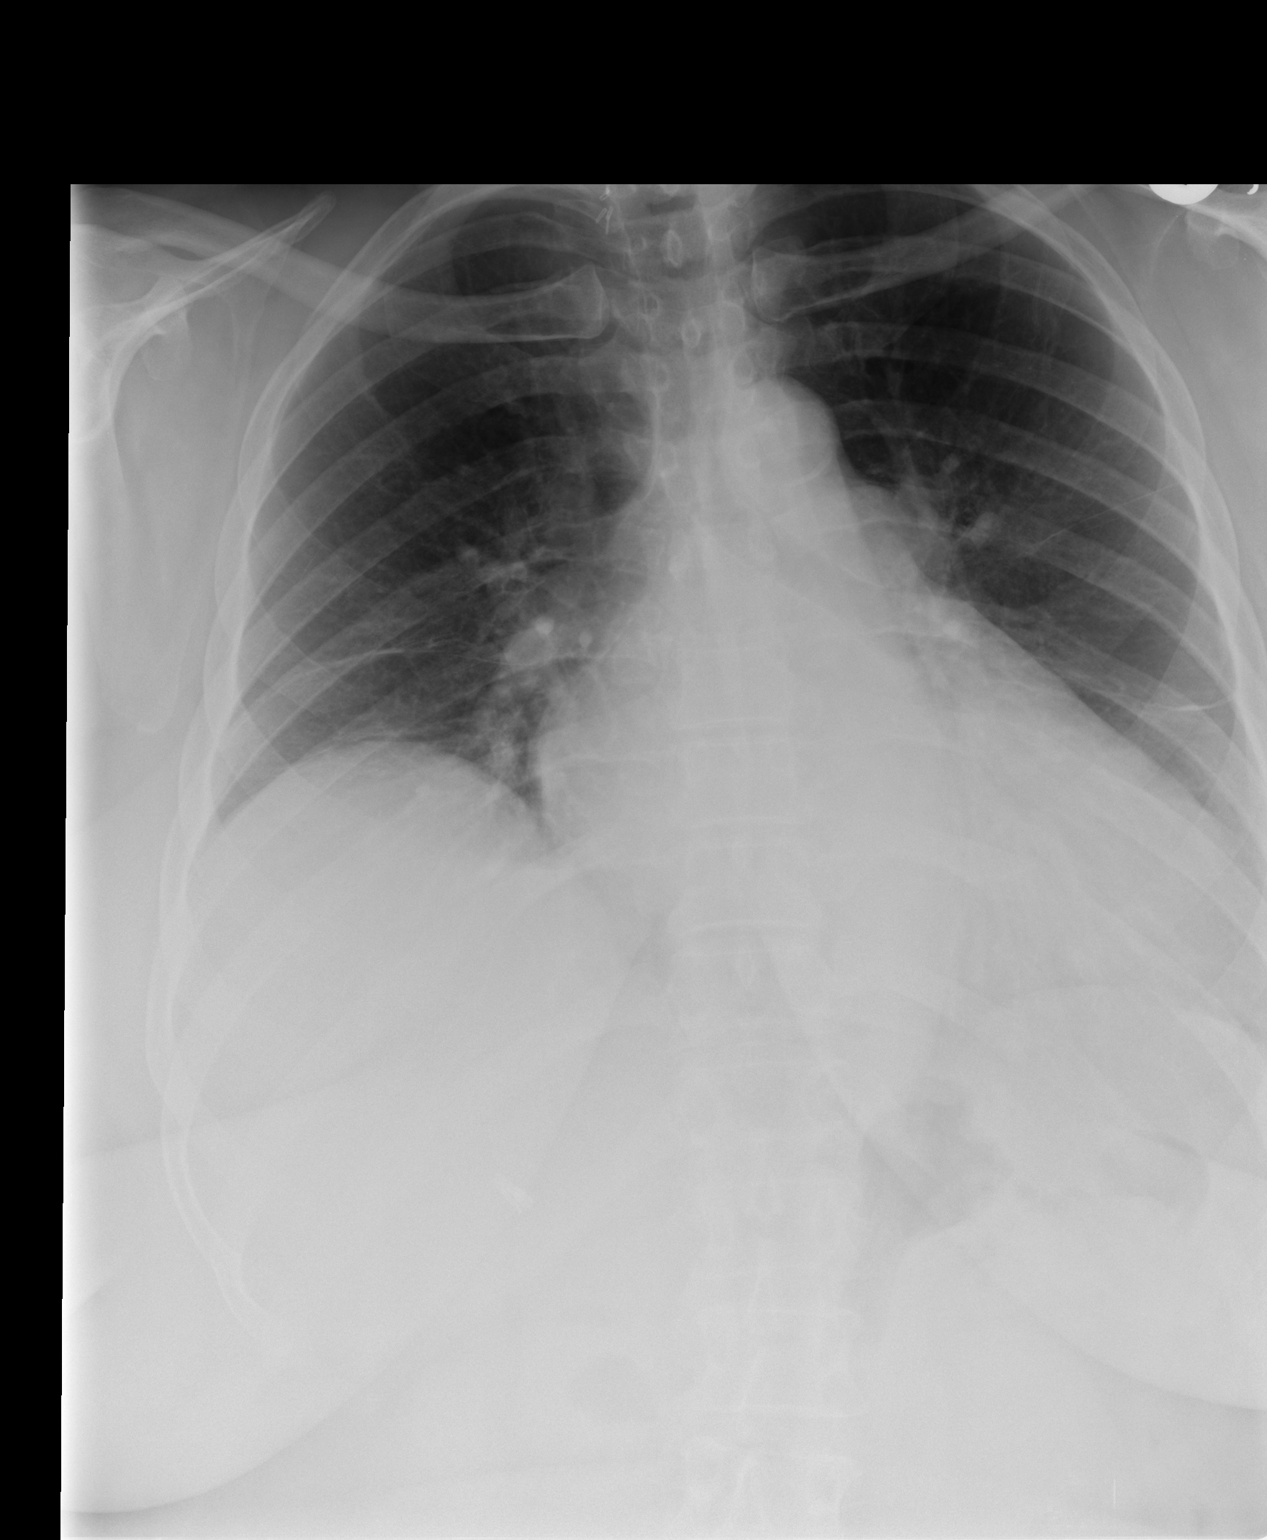

[view not recorded (3 of 3)]
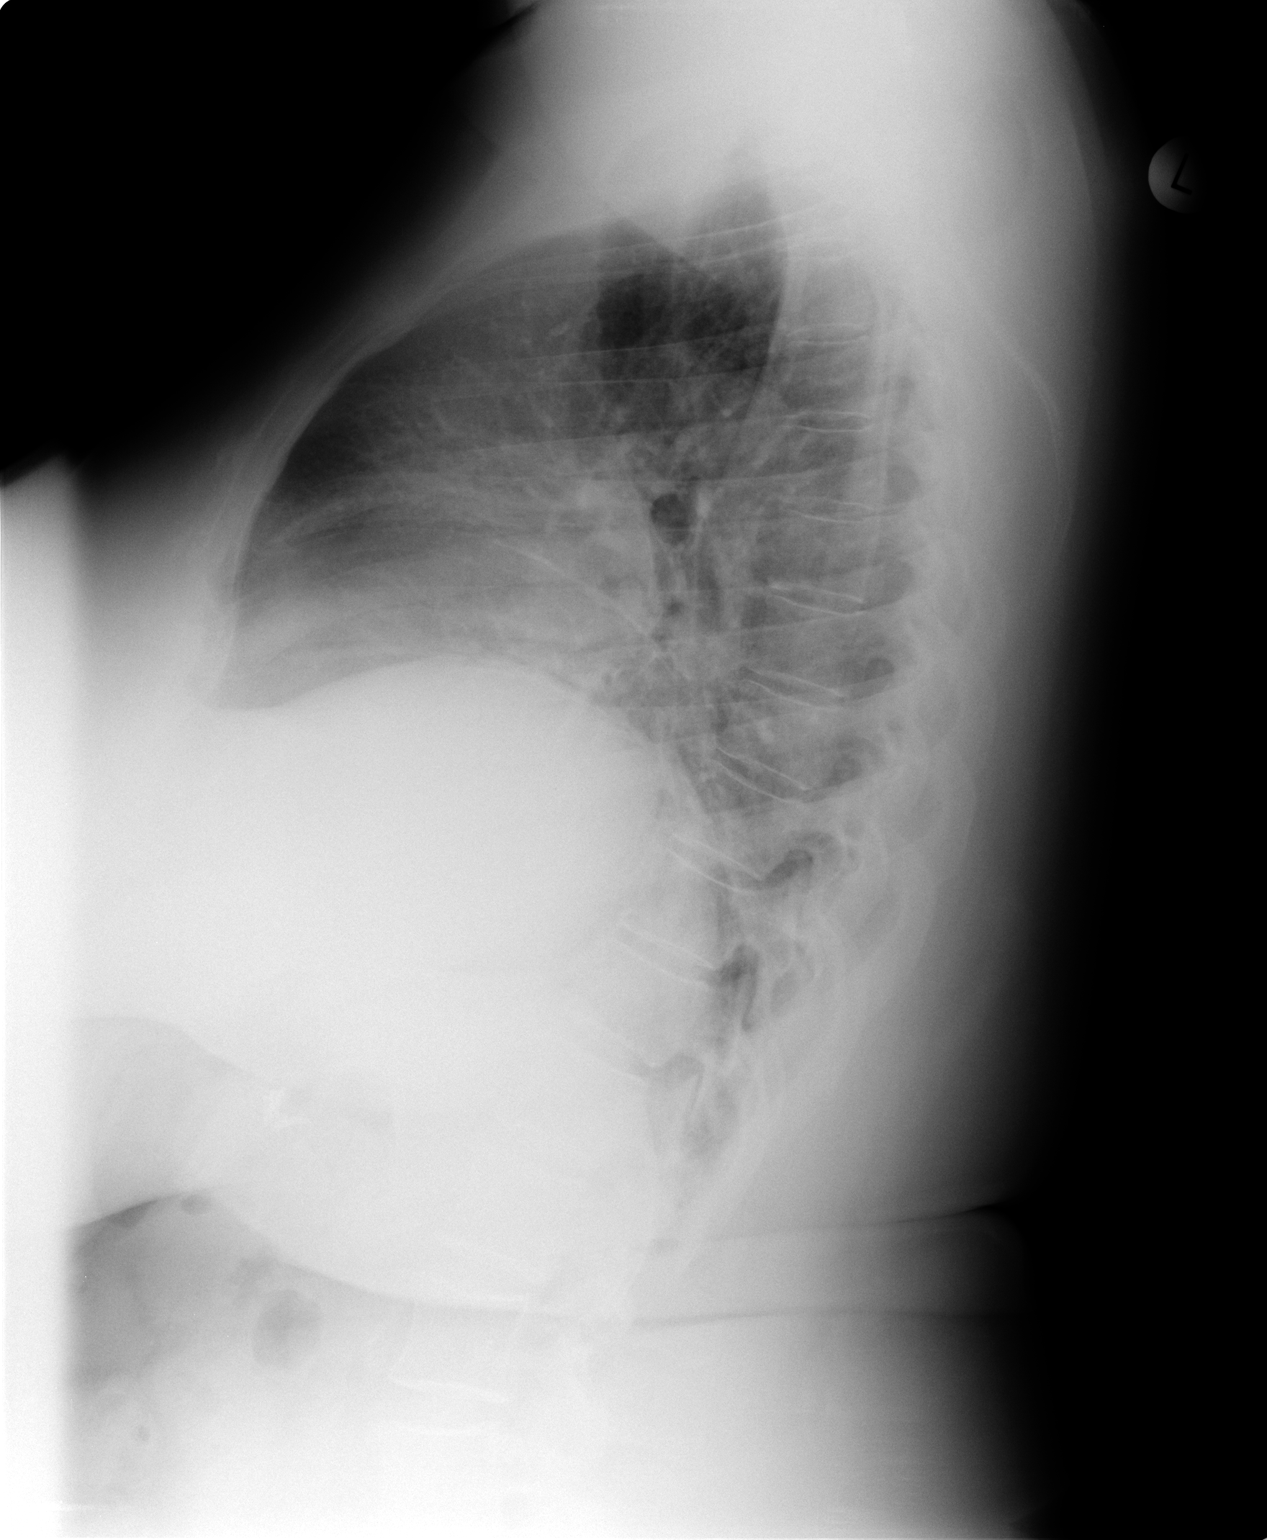

[3 of 3 positions shown; findings below may reference images not displayed]

FINDINGS: Heart is mildly enlarged. Aorta is mildly uncoiled. No mediastinal
or hilar masses. Elevation of the right hemidiaphragm is stable.
Lungs are clear. No pleural effusion or pneumothorax.

Bony thorax is intact. Surgical vascular clips are noted at the neck
base suggesting prior thyroid surgery.

No change from the prior exam.
IMPRESSION: No active cardiopulmonary disease.

## 2015-01-04 ENCOUNTER — Ambulatory Visit (INDEPENDENT_AMBULATORY_CARE_PROVIDER_SITE_OTHER): Payer: 59 | Admitting: *Deleted

## 2015-01-04 DIAGNOSIS — Z5181 Encounter for therapeutic drug level monitoring: Secondary | ICD-10-CM | POA: Diagnosis not present

## 2015-01-04 DIAGNOSIS — I480 Paroxysmal atrial fibrillation: Secondary | ICD-10-CM

## 2015-01-04 DIAGNOSIS — I4891 Unspecified atrial fibrillation: Secondary | ICD-10-CM

## 2015-01-04 LAB — POCT INR: INR: 4.3

## 2015-01-18 ENCOUNTER — Ambulatory Visit (INDEPENDENT_AMBULATORY_CARE_PROVIDER_SITE_OTHER): Payer: 59 | Admitting: *Deleted

## 2015-01-18 DIAGNOSIS — Z5181 Encounter for therapeutic drug level monitoring: Secondary | ICD-10-CM

## 2015-01-18 DIAGNOSIS — I480 Paroxysmal atrial fibrillation: Secondary | ICD-10-CM | POA: Diagnosis not present

## 2015-01-18 DIAGNOSIS — I4891 Unspecified atrial fibrillation: Secondary | ICD-10-CM | POA: Diagnosis not present

## 2015-01-18 LAB — POCT INR: INR: 2

## 2015-01-24 ENCOUNTER — Ambulatory Visit (INDEPENDENT_AMBULATORY_CARE_PROVIDER_SITE_OTHER): Payer: 59 | Admitting: Cardiology

## 2015-01-24 ENCOUNTER — Encounter: Payer: Self-pay | Admitting: Cardiology

## 2015-01-24 VITALS — BP 126/84 | HR 84 | Ht 66.0 in | Wt 250.8 lb

## 2015-01-24 DIAGNOSIS — Z7901 Long term (current) use of anticoagulants: Secondary | ICD-10-CM

## 2015-01-24 DIAGNOSIS — I480 Paroxysmal atrial fibrillation: Secondary | ICD-10-CM

## 2015-01-24 DIAGNOSIS — I421 Obstructive hypertrophic cardiomyopathy: Secondary | ICD-10-CM

## 2015-01-24 MED ORDER — AMIODARONE HCL 200 MG PO TABS: 200.0000 mg | ORAL_TABLET | Freq: Two times a day (BID) | ORAL | Status: DC

## 2015-01-24 NOTE — Patient Instructions (Addendum)
Medication Instructions:  Please increase your Amiodarone to 200 mg twice a day. Continue all other medications as listed.  Testing/Procedures: Your physician has requested that you have an echocardiogram. Echocardiography is a painless test that uses sound waves to create images of your heart. It provides your doctor with information about the size and shape of your heart and how well your heart's chambers and valves are working. This procedure takes approximately one hour. There are no restrictions for this procedure.  You have been referred to Dr Mayford Knifeurner to be evaluated for sleep apnea.  Follow-Up: Follow up in 1 month with Dr Anne FuSkains.  If you need a refill on your cardiac medications before your next appointment, please call your pharmacy.  Thank you for choosing [facility]!!

## 2015-01-24 NOTE — Progress Notes (Signed)
[address], [address] [place], [place]  [address] Phone: [phone] Fax:  [phone]  Date:  01/24/2015   ID:  Tammy Mckinney, DOB 02-Oct-1955, MRN [id number]  PCP:  Lolita Patella, MD   History of Present Illness: Tammy Mckinney is a 60 y.o. 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  She had cardioversion in June 2016. Amiodarone 200 mg. Doing well currently. She feels better in sinus rhythm.  Previous cardioversion 5/15 and 6/16.   She has no early family history of sudden cardiac death, no syncope, no palpitations, no angina. She also has OSA. and was not utilizing her CPAP machine.  Needs new equipment.   Today while coming in the office she did feel some palpitations, skipping of her heartbeat. No chest pain, no syncope, no significant shortness of breath. No cough, no fevers.  At work people with fever. [facility] [employer], EKG on 01/24/15 confirms atrial fibrillation. She is not short of breath. No chest pain.   Wt Readings from Last 3 Encounters:  01/24/15 250 lb 12.8 oz (113.762 kg)  07/17/14 255 lb (115.667 kg)  06/22/14 253 lb 6.4 oz (114.941 kg)     Past Medical History  Diagnosis Date  . Hypertension   . Dysrhythmia   . Sleep apnea     no cpap  . (HFpEF) heart failure with preserved ejection fraction (HCC)   . 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 Hypothyroidism   . Diabetes mellitus without complication (HCC)   . GERD (gastroesophageal reflux disease)     Past Surgical History  Procedure Laterality Date  . Abdominal hysterectomy    . Cholecystectomy    . Thyroidectomy    . Cardioversion N/A 06/09/2013    Procedure: CARDIOVERSION;  Surgeon: [name], MD;  Location[facility] ENDOSCOPY;  Service: Cardiovascular;  Laterality: N/A;  . Cardioversion N/A 06/28/2014    Procedure: CARDIOVERSION;  Surgeon: Jake Bathe, MD;  Location: [facility] ENDOSCOPY;  Service: Cardiovascular;  Laterality: N/A;    Current Outpatient Prescriptions  Medication Sig Dispense Refill  . acetaminophen (TYLENOL) 500 MG tablet Take 1,000 mg by mouth every 6 (six) hours as needed (pain).     Marland Kitchen amiodarone (PACERONE) 200 MG tablet Take 1 tablet (200 mg total) by mouth daily. 90 tablet 1  . atorvastatin (LIPITOR) 80 MG tablet Take 80 mg by mouth every evening.     . calcitRIOL (ROCALTROL) 0.25 MCG capsule Take 0.75 mcg by mouth every morning.     . fluticasone (FLONASE) 50 MCG/ACT nasal spray Place 1 spray into both nostrils 2 (two) times daily as needed for allergies.     Marland Kitchen levothyroxine (SYNTHROID, LEVOTHROID) 100 MCG tablet Take 100 mcg by mouth daily before breakfast.     . Multiple Vitamin (MULTIVITAMIN) tablet Take 1 tablet by mouth every evening.     . pantoprazole (PROTONIX) 40 MG tablet Take 40 mg by mouth daily as needed (reflux).     Marland Kitchen spironolactone (ALDACTONE) 25 MG tablet TAKE 1 TABLET (25 MG TOTAL) BY MOUTH DAILY. 30 tablet 5  . TANZEUM 50 MG PEN Inject 50 mg as directed once a week. Into the skin  5  . verapamil (CALAN-SR) 180 MG CR tablet TAKE 1 TABLET (180 MG TOTAL) BY MOUTH EVERY MORNING. 90 tablet 0  . warfarin (COUMADIN) 5 MG tablet TAKE AS  DIRECTED BY ANTICOAGULATION CLINIC 35 tablet 3   No current facility-administered medications for this visit.    Allergies:    Allergies  Allergen Reactions  . Oxycodone Hcl Itching    oxycontin    Social History:  The patient  reports that she quit smoking about 16 years ago. She does not have any smokeless tobacco history on file. She reports that she does not drink alcohol or use illicit drugs.   Family History  Problem Relation Age of Onset  . Hypertension Mother   . Heart attack Father   . Heart disease Father   . Hypertension Father   . Stroke Father     ROS:  Please see the history of present illness.   Denies any bleeding, syncope, orthopnea, PND    All other systems reviewed and negative. 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  PHYSICAL EXAM: VS:  BP 126/84 mmHg  Pulse 84  Ht 5\' 6"  (1.676 m)  Wt 250 lb 12.8 oz (113.762 kg)  BMI 40.50 kg/m2  SpO2 94% Well nourished, well developed, in no acute distress HEENT: normal, [place]/AT, EOMI Neck: no JVD, normal carotid upstroke, no bruit Cardiac:  irreg irreg; soft outflow tract murmur Lungs:  clear to auscultation bilaterally, no wheezing, rhonchi or rales Abd: soft, nontender, no hepatomegaly, no bruitsoverweight Ext: no edema, 2+ distal pulses Skin: warm and dry GU: deferred Neuro: no focal abnormalities noted, AAO x 3  EKG: Today  01/24/15- atrial fibrillation 75, left bundle branch block with PVCs. 07/17/14-sinus bradycardia with first-degree AV block 270 ms with PACs, nonspecific intraventricular block. Previous 06/22/14 05/09/14 - AFIB/Flutter 56. 06/01/13-atrial fibrillation heart rate 63, LDH, left axis deviation     ECHO: 08/11/10 -  1. Severe asymmetric septal hypertrophy. HOCM. 2. Left ventricular ejection fraction estimated by 2D at 50-55 percent. 3. There were no regional wall motion abnormalities. 4. Moderate left atrial enlargement. 5. Trace mitral valve regurgitation. 6. Trivial tricuspid regurgitation. 7. Mild calcification of the aortic valve. 8. Trace of pulmonic valve regurgitation. 9. Analysis of mitral valve inflow, pulmonary vein Doppler and tissue Doppler suggests grade I diastolic dysfunction without elevated left atrial pressure.s  Holter 7/12 - occasional PVCs, PACs, no atrial fibrillation, no ventricular tachycardia  ASSESSMENT AND PLAN:  1. Atrial fibrillation parox- amiodarone back to 200 mg to BID. Hopefully will convert. She has hypertrophic cardiomyopathy. Feels less fatigue. Less SOB after cardioversion on 06/28/14. Continue with verapamil. Hopefully, we will not have to proceed with cardioversion. 2. First-degree AV block. 3. Hypertrophic cardiomyopathy-severe  asymmetric septal hypertrophy, ejection fraction 50-55%. Trying to maintain sinus rhythm. She feels better in normal rhythm. we will repeat echocardiogram since it is been 2012 since last check.  4. Obesity-encourage weight loss. 5. Obstructive sleep apnea-she states that she is in need of new equipment. We will set her up in sleep apnea clinic.  6. Chronic anticoagulation- Please see notes 7. We will see back 1 months  Signed, Donato SchultzMark Tobi Leinweber, MD [facility]  01/24/2015 8:33 AM

## 2015-02-05 ENCOUNTER — Other Ambulatory Visit (HOSPITAL_COMMUNITY): Payer: 59

## 2015-02-08 ENCOUNTER — Ambulatory Visit (INDEPENDENT_AMBULATORY_CARE_PROVIDER_SITE_OTHER): Payer: 59 | Admitting: *Deleted

## 2015-02-08 ENCOUNTER — Ambulatory Visit (HOSPITAL_COMMUNITY): Payer: 59 | Attending: Cardiovascular Disease

## 2015-02-08 ENCOUNTER — Other Ambulatory Visit: Payer: Self-pay

## 2015-02-08 DIAGNOSIS — Z5181 Encounter for therapeutic drug level monitoring: Secondary | ICD-10-CM | POA: Diagnosis not present

## 2015-02-08 DIAGNOSIS — I313 Pericardial effusion (noninflammatory): Secondary | ICD-10-CM | POA: Insufficient documentation

## 2015-02-08 DIAGNOSIS — I351 Nonrheumatic aortic (valve) insufficiency: Secondary | ICD-10-CM | POA: Diagnosis not present

## 2015-02-08 DIAGNOSIS — I480 Paroxysmal atrial fibrillation: Secondary | ICD-10-CM

## 2015-02-08 DIAGNOSIS — Z6841 Body Mass Index (BMI) 40.0 and over, adult: Secondary | ICD-10-CM | POA: Insufficient documentation

## 2015-02-08 DIAGNOSIS — I10 Essential (primary) hypertension: Secondary | ICD-10-CM | POA: Diagnosis not present

## 2015-02-08 DIAGNOSIS — I340 Nonrheumatic mitral (valve) insufficiency: Secondary | ICD-10-CM | POA: Diagnosis not present

## 2015-02-08 DIAGNOSIS — E119 Type 2 diabetes mellitus without complications: Secondary | ICD-10-CM | POA: Diagnosis not present

## 2015-02-08 DIAGNOSIS — E785 Hyperlipidemia, unspecified: Secondary | ICD-10-CM | POA: Insufficient documentation

## 2015-02-08 DIAGNOSIS — Z87891 Personal history of nicotine dependence: Secondary | ICD-10-CM | POA: Insufficient documentation

## 2015-02-08 DIAGNOSIS — I517 Cardiomegaly: Secondary | ICD-10-CM | POA: Diagnosis not present

## 2015-02-08 DIAGNOSIS — I4891 Unspecified atrial fibrillation: Secondary | ICD-10-CM

## 2015-02-08 DIAGNOSIS — I071 Rheumatic tricuspid insufficiency: Secondary | ICD-10-CM | POA: Diagnosis not present

## 2015-02-08 LAB — POCT INR: INR: 1.9

## 2015-02-22 ENCOUNTER — Telehealth: Payer: Self-pay | Admitting: *Deleted

## 2015-02-22 NOTE — Telephone Encounter (Signed)
Pt has been notified of echo results and findings by phone with verbal understanding. Pt has appt 2/6 with Dr. Anne Fu; advised pt to keep appt. Pt agreeable.

## 2015-02-25 ENCOUNTER — Ambulatory Visit: Payer: 59 | Admitting: Cardiology

## 2015-02-27 ENCOUNTER — Ambulatory Visit (INDEPENDENT_AMBULATORY_CARE_PROVIDER_SITE_OTHER): Payer: 59 | Admitting: Cardiology

## 2015-02-27 ENCOUNTER — Encounter: Payer: Self-pay | Admitting: Cardiology

## 2015-02-27 ENCOUNTER — Ambulatory Visit (INDEPENDENT_AMBULATORY_CARE_PROVIDER_SITE_OTHER): Payer: 59 | Admitting: Pharmacist

## 2015-02-27 VITALS — BP 108/72 | HR 64 | Ht 64.0 in | Wt 242.8 lb

## 2015-02-27 DIAGNOSIS — I480 Paroxysmal atrial fibrillation: Secondary | ICD-10-CM

## 2015-02-27 DIAGNOSIS — Z7901 Long term (current) use of anticoagulants: Secondary | ICD-10-CM

## 2015-02-27 DIAGNOSIS — Z5181 Encounter for therapeutic drug level monitoring: Secondary | ICD-10-CM

## 2015-02-27 DIAGNOSIS — I4891 Unspecified atrial fibrillation: Secondary | ICD-10-CM

## 2015-02-27 DIAGNOSIS — I421 Obstructive hypertrophic cardiomyopathy: Secondary | ICD-10-CM

## 2015-02-27 LAB — POCT INR: INR: 3.4

## 2015-02-27 MED ORDER — AMIODARONE HCL 200 MG PO TABS: 200.0000 mg | ORAL_TABLET | Freq: Every day | ORAL | Status: DC

## 2015-02-27 NOTE — Patient Instructions (Addendum)
Medication Instructions:  Your physician has recommended you make the following change in your medication:  1) Decrease Amiodarone to  daily   Labwork: Your physician recommends that you return for lab work one week prior to DCCV   Testing/Procedures: Your physician has recommended that you have a Cardioversion (DCCV). Electrical Cardioversion uses a jolt of electricity to your heart either through paddles or wired patches attached to your chest. This is a controlled, usually prescheduled, procedure. Defibrillation is done under light anesthesia in the hospital, and you usually go home the day of the procedure. This is done to get your heart back into a normal rhythm. You are not awake for the procedure. Please see the instruction sheet given to you today.    Follow-Up:  Will need weekly INR's for 3-4 weeks.  If all therapeutic will then set up for Cardioversion   Your physician recommends that you schedule a follow-up appointment in: 6 weeks with a PA.     Any Other Special Instructions Will Be Listed Below (If Applicable).     If you need a refill on your cardiac medications before your next appointment, please call your pharmacy.

## 2015-02-27 NOTE — Progress Notes (Signed)
[address] [place], [place]  [address] Phone: [phone] Fax:  [phone]  Date:  02/27/2015   ID:  Tammy Mckinney, DOB [birth year], MRN [id number]  PCP:  Lolita Patella, MD   History of Present Illness: Tammy Mckinney is a 60 y.o. 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  She had cardioversion in June 2016. Amiodarone 200 mg. Doing well currently. She feels better in sinus rhythm.  Previous cardioversion 5/15 and 6/16.   She has no early family history of sudden cardiac death, no syncope, no palpitations, no angina. She also has OSA. and was not utilizing her CPAP machine.  Needs new equipment.   Today while coming in the office she did feel some palpitations, skipping of her heartbeat. No chest pain, no syncope, no significant shortness of breath. No cough, no fevers.  At work people with fever. [facility] [employer], EKG on 01/24/15 confirms atrial fibrillation. She is not short of breath. No chest pain.  02/27/15 When tear nose and throat because of gravelly voice, scope performed, vocal cord paralysis. PPI started. Physical therapy. She has lost approximate 10 pounds. Slightly less short of breath however she still remains in atrial fibrillation on exam. No bleeding. Went over echocardiogram with normal ejection fraction, severely dilated left atrium. No chest pain.   Wt Readings from Last 3 Encounters:  02/27/15 242 lb 12.8 oz (110.133 kg)  01/24/15 250 lb 12.8 oz (113.762 kg)  07/17/14 255 lb (115.667 kg)     Past Medical History  Diagnosis Date  . Hypertension   . Dysrhythmia   . Sleep apnea     no cpap  . (HFpEF) heart failure with preserved ejection fraction (HCC)   . 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 Hypothyroidism   . Diabetes mellitus without complication (HCC)   . GERD (gastroesophageal  reflux disease)     Past Surgical History  Procedure Laterality Date  . Abdominal hysterectomy    . Cholecystectomy    . Thyroidectomy    . Cardioversion N/A 06/09/2013    Procedure: CARDIOVERSION;  Surgeon: [name], MD;  Location: [facility] ENDOSCOPY;  Service: Cardiovascular;  Laterality: N/A;  . Cardioversion N/A 06/28/2014    Procedure: CARDIOVERSION;  Surgeon: Jake Bathe, MD;  Location: [facility] ENDOSCOPY;  Service: Cardiovascular;  Laterality: N/A;    Current Outpatient Prescriptions  Medication Sig Dispense Refill  . acetaminophen (TYLENOL) 500 MG tablet Take 1,000 mg by mouth every 6 (six) hours as needed (pain).     Marland Kitchen amiodarone (PACERONE) 200 MG tablet Take 1 tablet (200 mg total) by mouth 2 (two) times daily. 180 tablet 3  . atorvastatin (LIPITOR) 80 MG tablet Take 80 mg by mouth every evening.     . calcitRIOL (ROCALTROL) 0.25 MCG capsule Take 0.75 mcg by mouth every morning.     . fluticasone (FLONASE) 50 MCG/ACT nasal spray Place 1 spray into both nostrils 2 (two) times daily as needed for allergies.     Marland Kitchen levothyroxine (SYNTHROID, LEVOTHROID) 100 MCG tablet Take 100 mcg by mouth daily before breakfast.     . Multiple Vitamin (MULTIVITAMIN) tablet Take 1 tablet by mouth every evening.     . pantoprazole (PROTONIX) 40 MG tablet Take 40 mg by mouth daily as needed (reflux).     Marland Kitchen spironolactone (ALDACTONE) 25 MG tablet TAKE 1  TABLET (25 MG TOTAL) BY MOUTH DAILY. 30 tablet 5  . TANZEUM 50 MG PEN Inject 50 mg as directed once a week. Into the skin  5  . verapamil (CALAN-SR) 180 MG CR tablet TAKE 1 TABLET (180 MG TOTAL) BY MOUTH EVERY MORNING. 90 tablet 0  . warfarin (COUMADIN) 5 MG tablet TAKE AS DIRECTED BY ANTICOAGULATION CLINIC 35 tablet 3   No current facility-administered medications for this visit.    Allergies:    Allergies  Allergen Reactions  . Oxycodone Hcl Itching    oxycontin    Social History:  The patient  reports that she quit smoking about 16 years ago. She  does not have any smokeless tobacco history on file. She reports that she does not drink alcohol or use illicit drugs.   Family History  Problem Relation Age of Onset  . Hypertension Mother   . Heart attack Father   . Heart disease Father   . Hypertension Father   . Stroke Father     ROS:  Please see the history of present illness.   Denies any bleeding, syncope, orthopnea, PND   All other systems reviewed and negative. 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  PHYSICAL EXAM: VS:  BP 108/72 mmHg  Pulse 64  Ht 5' 4" (1.626 m)  Wt 242 lb 12.8 oz (110.133 kg)  BMI 41.66 kg/m2 Well nourished, well developed, in no acute distress HEENT: normal, [place]/AT, EOMI Neck: no JVD, normal carotid upstroke, no bruit Cardiac:  irreg irreg remains; soft outflow tract murmur Lungs:  clear to auscultation bilaterally, no wheezing, rhonchi or rales Abd: soft, nontender, no hepatomegaly, no bruitsoverweight Ext: no edema, 2+ distal pulses Skin: warm and dry GU: deferred Neuro: no focal abnormalities noted, AAO x 3  EKG:  01/24/15- atrial fibrillation 75, left bundle branch block with PVCs. 07/17/14-sinus bradycardia with first-degree AV block 270 ms with PACs, nonspecific intraventricular block. Previous 06/22/14 05/09/14 - AFIB/Flutter 56. 06/01/13-atrial fibrillation heart rate 63, LDH, left axis deviation     ECHO: 08/11/10 -  1. Severe asymmetric septal hypertrophy. HOCM. 2. Left ventricular ejection fraction estimated by 2D at 50-55 percent. 3. There were no regional wall motion abnormalities. 4. Moderate left atrial enlargement. 5. Trace mitral valve regurgitation. 6. Trivial tricuspid regurgitation. 7. Mild calcification of the aortic valve. 8. Trace of pulmonic valve regurgitation. 9. Analysis of mitral valve inflow, pulmonary vein Doppler and tissue Doppler suggests grade I diastolic dysfunction without elevated left atrial pressure.s  Holter 7/12 - occasional PVCs, PACs, no atrial fibrillation, no  ventricular tachycardia  ASSESSMENT AND PLAN:  1. Atrial fibrillation parox- irregularly irregular rhythm remains on exam. amiodarone loaded 200 mg to BID over the past month. We will move this back to 200 mg once a day. We will go ahead and set up for cardioversion after she demonstrates weekly INR is at goal. She has hypertrophic cardiomyopathy. Feels less fatigue. Less SOB after cardioversion on 06/28/14. Continue with verapamil.  2. First-degree AV block. 3. Hypertrophic cardiomyopathy-severe asymmetric septal hypertrophy, ejection fraction 55%. Trying to maintain sinus rhythm. This may be a challenge with her severely dilated left atrium. She feels better in normal rhythm.  4. Obesity-encourage weight loss. Good job 10 pounds 5. Obstructive sleep apnea-she states that she is in need of new equipment. We will set her up in sleep apnea clinic.  6. Chronic anticoagulation- Please see notes 7. We will see back 2 months  Signed, Marita Burnsed, MD FACC  02/27/2015   9:27 AM     

## 2015-03-02 ENCOUNTER — Other Ambulatory Visit: Payer: Self-pay | Admitting: Cardiology

## 2015-03-06 ENCOUNTER — Ambulatory Visit (INDEPENDENT_AMBULATORY_CARE_PROVIDER_SITE_OTHER): Payer: 59 | Admitting: *Deleted

## 2015-03-06 DIAGNOSIS — I480 Paroxysmal atrial fibrillation: Secondary | ICD-10-CM

## 2015-03-06 DIAGNOSIS — Z5181 Encounter for therapeutic drug level monitoring: Secondary | ICD-10-CM

## 2015-03-06 DIAGNOSIS — I4891 Unspecified atrial fibrillation: Secondary | ICD-10-CM

## 2015-03-06 LAB — POCT INR: INR: 2.2

## 2015-03-13 ENCOUNTER — Ambulatory Visit (INDEPENDENT_AMBULATORY_CARE_PROVIDER_SITE_OTHER): Payer: 59 | Admitting: *Deleted

## 2015-03-13 DIAGNOSIS — I480 Paroxysmal atrial fibrillation: Secondary | ICD-10-CM | POA: Diagnosis not present

## 2015-03-13 DIAGNOSIS — Z5181 Encounter for therapeutic drug level monitoring: Secondary | ICD-10-CM | POA: Diagnosis not present

## 2015-03-13 DIAGNOSIS — I4891 Unspecified atrial fibrillation: Secondary | ICD-10-CM | POA: Diagnosis not present

## 2015-03-13 LAB — POCT INR: INR: 3.3

## 2015-03-14 ENCOUNTER — Telehealth: Payer: Self-pay

## 2015-03-14 DIAGNOSIS — Z01812 Encounter for preprocedural laboratory examination: Secondary | ICD-10-CM

## 2015-03-14 DIAGNOSIS — I480 Paroxysmal atrial fibrillation: Secondary | ICD-10-CM

## 2015-03-14 NOTE — Telephone Encounter (Signed)
Scheduled patient 03/26/15 with Dr. Anne Fu. Patient to have pre-procedure lab work and pick up instruction letter 2/28.  Confirmation # [id number]

## 2015-03-14 NOTE — Telephone Encounter (Signed)
-----   Message from Sharin Grave, RN sent at 03/13/2015  5:43 PM EST ----- Regarding: FW: cardioversion Please call and schedule this pt for outpt cardioversion. Scheduling was already closed when I saw this staff message   Thank you!!  Pam    ----- Message -----    From: Jake Bathe, MD    Sent: 03/13/2015   4:56 PM      To: Sharin Grave, RN Subject: cardioversion                                  Cardioversion time.  [name], MD   ----- Message -----    From: Jeannine Kitten, RN    Sent: 03/13/2015   4:47 PM      To: Jake Bathe, MD  Dr Neomia Dear pt in coumadin clinic today and this is her third INR in therapeutic range  Just wanted to let  you know  Thanks Lelon Perla RN

## 2015-03-19 ENCOUNTER — Encounter: Payer: Self-pay | Admitting: Cardiology

## 2015-03-19 ENCOUNTER — Ambulatory Visit (INDEPENDENT_AMBULATORY_CARE_PROVIDER_SITE_OTHER): Payer: 59 | Admitting: Cardiology

## 2015-03-19 ENCOUNTER — Other Ambulatory Visit (INDEPENDENT_AMBULATORY_CARE_PROVIDER_SITE_OTHER): Payer: 59

## 2015-03-19 ENCOUNTER — Ambulatory Visit (INDEPENDENT_AMBULATORY_CARE_PROVIDER_SITE_OTHER): Payer: 59 | Admitting: Pharmacist

## 2015-03-19 VITALS — BP 116/82 | HR 88 | Ht 66.0 in | Wt 242.0 lb

## 2015-03-19 DIAGNOSIS — I10 Essential (primary) hypertension: Secondary | ICD-10-CM

## 2015-03-19 DIAGNOSIS — G4733 Obstructive sleep apnea (adult) (pediatric): Secondary | ICD-10-CM | POA: Diagnosis not present

## 2015-03-19 DIAGNOSIS — I480 Paroxysmal atrial fibrillation: Secondary | ICD-10-CM | POA: Diagnosis not present

## 2015-03-19 DIAGNOSIS — E669 Obesity, unspecified: Secondary | ICD-10-CM | POA: Diagnosis not present

## 2015-03-19 DIAGNOSIS — I4891 Unspecified atrial fibrillation: Secondary | ICD-10-CM | POA: Diagnosis not present

## 2015-03-19 DIAGNOSIS — Z5181 Encounter for therapeutic drug level monitoring: Secondary | ICD-10-CM | POA: Diagnosis not present

## 2015-03-19 LAB — CBC WITH DIFFERENTIAL/PLATELET
Basophils Absolute: 0.1 10*3/uL (ref 0.0–0.1)
Basophils Relative: 1 % (ref 0–1)
EOS PCT: 1 % (ref 0–5)
Eosinophils Absolute: 0.1 10*3/uL (ref 0.0–0.7)
HCT: 42 % (ref 36.0–46.0)
HEMOGLOBIN: 13.6 g/dL (ref 12.0–15.0)
LYMPHS ABS: 1.7 10*3/uL (ref 0.7–4.0)
LYMPHS PCT: 29 % (ref 12–46)
MCH: 29 pg (ref 26.0–34.0)
MCHC: 32.4 g/dL (ref 30.0–36.0)
MCV: 89.6 fL (ref 78.0–100.0)
MONO ABS: 0.5 10*3/uL (ref 0.1–1.0)
MONOS PCT: 9 % (ref 3–12)
MPV: 9 fL (ref 8.6–12.4)
NEUTROS ABS: 3.5 10*3/uL (ref 1.7–7.7)
Neutrophils Relative %: 60 % (ref 43–77)
Platelets: 512 10*3/uL — ABNORMAL HIGH (ref 150–400)
RBC: 4.69 MIL/uL (ref 3.87–5.11)
RDW: 14.2 % (ref 11.5–15.5)
WBC: 5.8 10*3/uL (ref 4.0–10.5)

## 2015-03-19 LAB — BASIC METABOLIC PANEL
BUN: 13 mg/dL (ref 7–25)
CO2: 32 mmol/L — AB (ref 20–31)
Calcium: 9.6 mg/dL (ref 8.6–10.4)
Chloride: 98 mmol/L (ref 98–110)
Creat: 1.1 mg/dL — ABNORMAL HIGH (ref 0.50–0.99)
GLUCOSE: 99 mg/dL (ref 65–99)
Potassium: 4.4 mmol/L (ref 3.5–5.3)
Sodium: 139 mmol/L (ref 135–146)

## 2015-03-19 LAB — POCT INR: INR: 4.2

## 2015-03-19 NOTE — Patient Instructions (Signed)

## 2015-03-19 NOTE — Progress Notes (Signed)
Cardiology Office Note   Date:  03/19/2015   ID:  CARIME DINKEL, DOB 05/29/1955, MRN [id number]  PCP:  Lolita Patella, MD    No chief complaint on file.     History of Present Illness: Tammy Mckinney is a 60 y.o. female who presents for evaluation of OSA.  She was diagnosed with OSA years ago and has been on CPAP but not using it every night.  She has a history of PAF and was recently diagnosed with recurrent afib and is set up for DCCV.  She does not sleep well at night and wakes up frequently for unknown reasons.  She never wakes up gasping for air.  She snores when she does not where the CPAP.  When she wears the CPAP she sleeps well and feels rested in the am.  She uses the nasal pillow mask which she does not like because it irritates her nose.  She has chronic nasal congestion and takes flonase.  She walks for exercise.    Past Medical History  Diagnosis Date  . Hypertension   . Dysrhythmia   . Sleep apnea     no cpap  . (HFpEF) heart failure with preserved ejection fraction (HCC)   . 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 Hypothyroidism   . Diabetes mellitus without complication (HCC)   . GERD (gastroesophageal reflux disease)     Past Surgical History  Procedure Laterality Date  . Abdominal hysterectomy    . Cholecystectomy    . Thyroidectomy    . Cardioversion N/A 06/09/2013    Procedure: CARDIOVERSION;  Surgeon: [name], MD;  Location: [facility] ENDOSCOPY;  Service: Cardiovascular;  Laterality: N/A;  . Cardioversion N/A 06/28/2014    Procedure: CARDIOVERSION;  Surgeon: Jake Bathe, MD;  Location: [facility] ENDOSCOPY;  Service: Cardiovascular;  Laterality: N/A;     Current Outpatient Prescriptions  Medication Sig Dispense Refill  . acetaminophen (TYLENOL) 500 MG tablet Take 1,000 mg by mouth every 6 (six) hours as needed (pain).     Marland Kitchen amiodarone (PACERONE) 200 MG tablet TAKE 1 TABLET (200 MG TOTAL) BY MOUTH DAILY. 90 tablet 3  .  atorvastatin (LIPITOR) 80 MG tablet Take 80 mg by mouth every evening.     . calcitRIOL (ROCALTROL) 0.25 MCG capsule Take 0.75 mcg by mouth every morning.     . fluticasone (FLONASE) 50 MCG/ACT nasal spray Place 1 spray into both nostrils 2 (two) times daily as needed for allergies.     Marland Kitchen levothyroxine (SYNTHROID, LEVOTHROID) 100 MCG tablet Take 100 mcg by mouth daily before breakfast.     . Multiple Vitamin (MULTIVITAMIN) tablet Take 1 tablet by mouth every evening.     . pantoprazole (PROTONIX) 40 MG tablet Take 40 mg by mouth daily as needed (reflux).     Marland Kitchen spironolactone (ALDACTONE) 25 MG tablet TAKE 1 TABLET (25 MG TOTAL) BY MOUTH DAILY. 30 tablet 5  . TANZEUM 50 MG PEN Inject 50 mg as directed once a week. Into the skin  5  . verapamil (CALAN-SR) 180 MG CR tablet TAKE 1 TABLET (180 MG TOTAL) BY MOUTH EVERY MORNING. 90 tablet 0  . warfarin (COUMADIN) 5 MG tablet TAKE AS DIRECTED BY ANTICOAGULATION CLINIC 35 tablet 3   No current facility-administered medications for this visit.    Allergies:   Oxycodone hcl    Social History:  The  patient  reports that she quit smoking about 16 years ago. She does not have any smokeless tobacco history on file. She reports that she does not drink alcohol or use illicit drugs.   Family History:  The patient's family history includes Heart attack in her father; Heart disease in her father; Hypertension in her father and mother; Stroke in her father.    ROS:  Please see the history of present illness.   Otherwise, review of systems are positive for none.   All other systems are reviewed and negative.    PHYSICAL EXAM: VS:  BP 116/82 mmHg  Pulse 88  Ht  (1.676 m)  Wt 242 lb (109.77 kg)  BMI 39.08 kg/m2 , BMI Body mass index is 39.08 kg/(m^2). GEN: Well nourished, well developed, in no acute distress HEENT: normal Neck: no JVD, carotid bruits, or masses Cardiac: irregularly irregular; no murmurs, rubs, or gallops,no edema  Respiratory:  clear  to auscultation bilaterally, normal work of breathing GI: soft, nontender, nondistended, + BS MS: no deformity or atrophy Skin: warm and dry, no rash Neuro:  Strength and sensation are intact Psych: euthymic mood, full affect   EKG:  EKG is not ordered today.    Recent Labs: 06/22/2014: BUN 12; Creatinine, Ser 0.99; Hemoglobin 14.2; Platelets 414.0*; Potassium 4.5; Sodium 137    Lipid Panel No results found for: CHOL, TRIG, HDL, CHOLHDL, VLDL, LDLCALC, LDLDIRECT    Wt Readings from Last 3 Encounters:  03/19/15 242 lb (109.77 kg)  02/27/15 242 lb 12.8 oz (110.133 kg)  01/24/15 250 lb 12.8 oz (113.762 kg)       ASSESSMENT AND PLAN:  1.  OSA on CPAP therapy but her machine is > 35 years old.  She would like a new machine.  She is having some problems with her nasal pillow mask so I will switch her to a nasal mask.  I informed her that her insurance may require a new PSG prior to a new machine.  Patient has been using and benefiting from CPAP use and will continue to benefit from therapy. I encouraged her to try to be more compliant with her CPAP device which will help maintain her in NSR.  I will get a d/l from her DME. 2. Obesity - I encouraged her to continue her exercise program and watch her portions and carbs. 3.  HTN - controlled   Current medicines are reviewed at length with the patient today.  The patient does not have concerns regarding medicines.  The following changes have been made:  no change  Labs/ tests ordered today: See above Assessment and Plan No orders of the defined types were placed in this encounter.     Disposition:   FU with me in 1 year  Signed, Quintella Reichert, MD  03/19/2015 9:21 AM    [facility] [address] [place], [place], [place]  [address] Phone: [phone]; Fax: [phone]

## 2015-03-21 ENCOUNTER — Other Ambulatory Visit: Payer: Self-pay | Admitting: Obstetrics and Gynecology

## 2015-03-22 LAB — CYTOLOGY - PAP

## 2015-03-25 ENCOUNTER — Ambulatory Visit (INDEPENDENT_AMBULATORY_CARE_PROVIDER_SITE_OTHER): Payer: 59

## 2015-03-25 ENCOUNTER — Other Ambulatory Visit: Payer: Self-pay | Admitting: Cardiology

## 2015-03-25 ENCOUNTER — Other Ambulatory Visit: Payer: Self-pay | Admitting: Obstetrics and Gynecology

## 2015-03-25 DIAGNOSIS — I480 Paroxysmal atrial fibrillation: Secondary | ICD-10-CM | POA: Diagnosis not present

## 2015-03-25 DIAGNOSIS — I4891 Unspecified atrial fibrillation: Secondary | ICD-10-CM

## 2015-03-25 DIAGNOSIS — Z5181 Encounter for therapeutic drug level monitoring: Secondary | ICD-10-CM | POA: Diagnosis not present

## 2015-03-25 DIAGNOSIS — R928 Other abnormal and inconclusive findings on diagnostic imaging of breast: Secondary | ICD-10-CM

## 2015-03-25 LAB — POCT INR: INR: 2.6

## 2015-03-26 ENCOUNTER — Ambulatory Visit (HOSPITAL_COMMUNITY): Payer: 59 | Admitting: Anesthesiology

## 2015-03-26 ENCOUNTER — Encounter (HOSPITAL_COMMUNITY): Payer: Self-pay | Admitting: *Deleted

## 2015-03-26 ENCOUNTER — Encounter (HOSPITAL_COMMUNITY)
Admission: RE | Disposition: A | Discharge status: Home / Self Care | Payer: Self-pay | Source: Ambulatory Visit | Attending: Cardiology

## 2015-03-26 ENCOUNTER — Ambulatory Visit (HOSPITAL_COMMUNITY)
Admission: RE | Admit: 2015-03-26 | Discharge: 2015-03-26 | Disposition: A | Discharge status: Home / Self Care | Payer: 59 | Source: Ambulatory Visit | Attending: Cardiology | Admitting: Cardiology

## 2015-03-26 DIAGNOSIS — E119 Type 2 diabetes mellitus without complications: Secondary | ICD-10-CM | POA: Insufficient documentation

## 2015-03-26 DIAGNOSIS — I480 Paroxysmal atrial fibrillation: Secondary | ICD-10-CM | POA: Insufficient documentation

## 2015-03-26 DIAGNOSIS — G4733 Obstructive sleep apnea (adult) (pediatric): Secondary | ICD-10-CM | POA: Insufficient documentation

## 2015-03-26 DIAGNOSIS — I4891 Unspecified atrial fibrillation: Secondary | ICD-10-CM

## 2015-03-26 DIAGNOSIS — E039 Hypothyroidism, unspecified: Secondary | ICD-10-CM | POA: Diagnosis not present

## 2015-03-26 DIAGNOSIS — K219 Gastro-esophageal reflux disease without esophagitis: Secondary | ICD-10-CM | POA: Insufficient documentation

## 2015-03-26 DIAGNOSIS — I5032 Chronic diastolic (congestive) heart failure: Secondary | ICD-10-CM | POA: Insufficient documentation

## 2015-03-26 DIAGNOSIS — Z87891 Personal history of nicotine dependence: Secondary | ICD-10-CM | POA: Insufficient documentation

## 2015-03-26 DIAGNOSIS — Z7901 Long term (current) use of anticoagulants: Secondary | ICD-10-CM | POA: Diagnosis not present

## 2015-03-26 DIAGNOSIS — I110 Hypertensive heart disease with heart failure: Secondary | ICD-10-CM | POA: Diagnosis not present

## 2015-03-26 DIAGNOSIS — Z79899 Other long term (current) drug therapy: Secondary | ICD-10-CM | POA: Insufficient documentation

## 2015-03-26 DIAGNOSIS — I421 Obstructive hypertrophic cardiomyopathy: Secondary | ICD-10-CM | POA: Diagnosis not present

## 2015-03-26 DIAGNOSIS — Z6841 Body Mass Index (BMI) 40.0 and over, adult: Secondary | ICD-10-CM | POA: Insufficient documentation

## 2015-03-26 HISTORY — PX: CARDIOVERSION: SHX1299

## 2015-03-26 SURGERY — CARDIOVERSION
Anesthesia: General

## 2015-03-26 MED ORDER — SODIUM CHLORIDE 0.9 % IV SOLN
INTRAVENOUS | Status: DC | PRN
  Administered 2015-03-26: 13:00:00 via INTRAVENOUS

## 2015-03-26 MED ORDER — SODIUM CHLORIDE 0.9 % IV SOLN
250.0000 mL | INTRAVENOUS | Status: DC
  Administered 2015-03-26: 250 mL via INTRAVENOUS

## 2015-03-26 MED ORDER — SODIUM CHLORIDE 0.45 % IV SOLN: INTRAVENOUS | Status: DC

## 2015-03-26 MED ORDER — PROPOFOL 10 MG/ML IV BOLUS
INTRAVENOUS | Status: DC | PRN
  Administered 2015-03-26: 70 mg via INTRAVENOUS

## 2015-03-26 MED ORDER — LIDOCAINE HCL (CARDIAC) 20 MG/ML IV SOLN
INTRAVENOUS | Status: DC | PRN
  Administered 2015-03-26: 40 mg via INTRATRACHEAL

## 2015-03-26 MED ORDER — HYDROCORTISONE 1 % EX CREA
1.0000 | TOPICAL_CREAM | Freq: Three times a day (TID) | CUTANEOUS | Status: DC | PRN
Start: 2015-03-26 — End: 2015-03-26
  Filled 2015-03-26: qty 28

## 2015-03-26 MED ORDER — SODIUM CHLORIDE 0.9% FLUSH: 3.0000 mL | INTRAVENOUS | Status: DC | PRN

## 2015-03-26 MED ORDER — SODIUM CHLORIDE 0.9% FLUSH: 3.0000 mL | Freq: Two times a day (BID) | INTRAVENOUS | Status: DC

## 2015-03-26 NOTE — Anesthesia Postprocedure Evaluation (Signed)
Anesthesia Post Note  Patient: Minta BalsamCynthia D Finken  Procedure(s) Performed: Procedure(s) (LRB): CARDIOVERSION (N/A)  Patient location during evaluation: PACU Anesthesia Type: General Level of consciousness: awake and alert Pain management: pain level controlled Vital Signs Assessment: post-procedure vital signs reviewed and stable Respiratory status: spontaneous breathing, nonlabored ventilation and respiratory function stable Cardiovascular status: blood pressure returned to baseline and stable Postop Assessment: no signs of nausea or vomiting Anesthetic complications: no    Last Vitals:  Filed Vitals:   03/26/15 1310 03/26/15 1320  BP: 159/102 145/99  Pulse: 59 61  Resp: 14 14    Last Pain: There were no vitals filed for this visit.               Cassandre Oleksy,W. EDMOND

## 2015-03-26 NOTE — Transfer of Care (Signed)
Immediate Anesthesia Transfer of Care Note  Patient: Minta BalsamCynthia D Chiappetta  Procedure(s) Performed: Procedure(s): CARDIOVERSION (N/A)  Patient Location: Endoscopy Unit  Anesthesia Type:MAC  Level of Consciousness: sedated  Airway & Oxygen Therapy: Patient Spontanous Breathing and Patient connected to face mask oxygen  Post-op Assessment: Report given to RN and Post -op Vital signs reviewed and stable  Post vital signs: Reviewed and stable  Last Vitals:  Filed Vitals:   03/26/15 1139  BP: 143/96  Pulse: 77  Resp: 22    Complications: No apparent anesthesia complications

## 2015-03-26 NOTE — Interval H&P Note (Signed)
History and Physical Interval Note:  03/26/2015 11:26 AM  Tammy Mckinney  has presented today for surgery, with the diagnosis of a-fib  The various methods of treatment have been discussed with the patient and family. After consideration of risks, benefits and other options for treatment, the patient has consented to  Procedure(s): CARDIOVERSION (N/A) as a surgical intervention .  The patient's history has been reviewed, patient examined, no change in status, stable for surgery.  I have reviewed the patient's chart and labs.  Questions were answered to the patient's satisfaction.     SKAINS, MARK

## 2015-03-26 NOTE — H&P (View-Only) (Signed)
[address] [place], [place]  [address] Phone: [phone] Fax:  [phone]  Date:  02/27/2015   ID:  Tammy Mckinney, DOB 09/08/1955, MRN [id number]  PCP:  Lolita PatellaEADE,ROBERT ALEXANDER, MD   History of Present Illness: Tammy Mckinney is a 60 y.o. 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  She had cardioversion in June 2016. Amiodarone 200 mg. Doing well currently. She feels better in sinus rhythm.  Previous cardioversion 5/15 and 6/16.   She has no early family history of sudden cardiac death, no syncope, no palpitations, no angina. She also has OSA. and was not utilizing her CPAP machine.  Needs new equipment.   Today while coming in the office she did feel some palpitations, skipping of her heartbeat. No chest pain, no syncope, no significant shortness of breath. No cough, no fevers.  At work people with fever. [employer], EKG on 01/24/15 confirms atrial fibrillation. She is not short of breath. No chest pain.  02/27/15 When tear nose and throat because of gravelly voice, scope performed, vocal cord paralysis. PPI started. Physical therapy. She has lost approximate 10 pounds. Slightly less short of breath however she still remains in atrial fibrillation on exam. No bleeding. Went over echocardiogram with normal ejection fraction, severely dilated left atrium. No chest pain.   Wt Readings from Last 3 Encounters:  02/27/15 242 lb 12.8 oz (110.133 kg)  01/24/15 250 lb 12.8 oz (113.762 kg)  07/17/14 255 lb (115.667 kg)     Past Medical History  Diagnosis Date  . Hypertension   . Dysrhythmia   . Sleep apnea     no cpap  . (HFpEF) heart failure with preserved ejection fraction (HCC)   . 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 Hypothyroidism   . Diabetes mellitus without complication (HCC)   . GERD (gastroesophageal  reflux disease)     Past Surgical History  Procedure Laterality Date  . Abdominal hysterectomy    . Cholecystectomy    . Thyroidectomy    . Cardioversion N/A 06/09/2013    Procedure: CARDIOVERSION;  Surgeon: Donato SchultzMark Rosali Augello, MD;  [facility];  Service: Cardiovascular;  Laterality: N/A;  . Cardioversion N/A 06/28/2014    Procedure: CARDIOVERSION;  Surgeon: Jake BatheMark C Haillee Johann, MD;  [facility];  Service: Cardiovascular;  Laterality: N/A;    Current Outpatient Prescriptions  Medication Sig Dispense Refill  . acetaminophen (TYLENOL) 500 MG tablet Take 1,000 mg by mouth every 6 (six) hours as needed (pain).     Marland Kitchen. amiodarone (PACERONE) 200 MG tablet Take 1 tablet (200 mg total) by mouth 2 (two) times daily. 180 tablet 3  . atorvastatin (LIPITOR) 80 MG tablet Take 80 mg by mouth every evening.     . calcitRIOL (ROCALTROL) 0.25 MCG capsule Take 0.75 mcg by mouth every morning.     . fluticasone (FLONASE) 50 MCG/ACT nasal spray Place 1 spray into both nostrils 2 (two) times daily as needed for allergies.     Marland Kitchen. levothyroxine (SYNTHROID, LEVOTHROID) 100 MCG tablet Take 100 mcg by mouth daily before breakfast.     . Multiple Vitamin (MULTIVITAMIN) tablet Take 1 tablet by mouth every evening.     . pantoprazole (PROTONIX) 40 MG tablet Take 40 mg by mouth daily as needed (reflux).     Marland Kitchen. spironolactone (ALDACTONE) 25 MG tablet TAKE 1  TABLET (25 MG TOTAL) BY MOUTH DAILY. 30 tablet 5  . TANZEUM 50 MG PEN Inject 50 mg as directed once a week. Into the skin  5  . verapamil (CALAN-SR) 180 MG CR tablet TAKE 1 TABLET (180 MG TOTAL) BY MOUTH EVERY MORNING. 90 tablet 0  . warfarin (COUMADIN) 5 MG tablet TAKE AS DIRECTED BY ANTICOAGULATION CLINIC 35 tablet 3   No current facility-administered medications for this visit.    Allergies:    Allergies  Allergen Reactions  . Oxycodone Hcl Itching    oxycontin    Social History:  The patient  reports that she quit smoking about 16 years ago. She  does not have any smokeless tobacco history on file. She reports that she does not drink alcohol or use illicit drugs.   Family History  Problem Relation Age of Onset  . Hypertension Mother   . Heart attack Father   . Heart disease Father   . Hypertension Father   . Stroke Father     ROS:  Please see the history of present illness.   Denies any bleeding, syncope, orthopnea, PND   All other systems reviewed and negative. 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  PHYSICAL EXAM: VS:  BP 108/72 mmHg  Pulse 64  Ht  (1.626 m)  Wt 242 lb 12.8 oz (110.133 kg)  BMI 41.66 kg/m2 Well nourished, well developed, in no acute distress HEENT: normal, [place]/AT, EOMI Neck: no JVD, normal carotid upstroke, no bruit Cardiac:  irreg irreg remains; soft outflow tract murmur Lungs:  clear to auscultation bilaterally, no wheezing, rhonchi or rales Abd: soft, nontender, no hepatomegaly, no bruitsoverweight Ext: no edema, 2+ distal pulses Skin: warm and dry GU: deferred Neuro: no focal abnormalities noted, AAO x 3  EKG:  01/24/15- atrial fibrillation 75, left bundle branch block with PVCs. 07/17/14-sinus bradycardia with first-degree AV block 270 ms with PACs, nonspecific intraventricular block. Previous 06/22/14 05/09/14 - AFIB/Flutter 56. 06/01/13-atrial fibrillation heart rate 63, LDH, left axis deviation     ECHO: 08/11/10 -  1. Severe asymmetric septal hypertrophy. HOCM. 2. Left ventricular ejection fraction estimated by 2D at 50-55 percent. 3. There were no regional wall motion abnormalities. 4. Moderate left atrial enlargement. 5. Trace mitral valve regurgitation. 6. Trivial tricuspid regurgitation. 7. Mild calcification of the aortic valve. 8. Trace of pulmonic valve regurgitation. 9. Analysis of mitral valve inflow, pulmonary vein Doppler and tissue Doppler suggests grade I diastolic dysfunction without elevated left atrial pressure.s  Holter 7/12 - occasional PVCs, PACs, no atrial fibrillation, no  ventricular tachycardia  ASSESSMENT AND PLAN:  1. Atrial fibrillation parox- irregularly irregular rhythm remains on exam. amiodarone loaded 200 mg to BID over the past month. We will move this back to 200 mg once a day. We will go ahead and set up for cardioversion after she demonstrates weekly INR is at goal. She has hypertrophic cardiomyopathy. Feels less fatigue. Less SOB after cardioversion on 06/28/14. Continue with verapamil.  2. First-degree AV block. 3. Hypertrophic cardiomyopathy-severe asymmetric septal hypertrophy, ejection fraction 55%. Trying to maintain sinus rhythm. This may be a challenge with her severely dilated left atrium. She feels better in normal rhythm.  4. Obesity-encourage weight loss. Good job 10 pounds 5. Obstructive sleep apnea-she states that she is in need of new equipment. We will set her up in sleep apnea clinic.  6. Chronic anticoagulation- Please see notes 7. We will see back 2 months  Signed, [name], MD [facility]  02/27/2015  9:27 AM

## 2015-03-26 NOTE — Anesthesia Preprocedure Evaluation (Addendum)
Anesthesia Evaluation  Patient identified by MRN, date of birth, ID band Patient awake    Reviewed: Allergy & Precautions, H&P , NPO status , Patient's Chart, lab work & pertinent test results  Airway Mallampati: II  TM Distance: >3 FB Neck ROM: Full    Dental no notable dental hx. (+) Upper Dentures, Dental Advisory Given   Pulmonary sleep apnea , former smoker,    Pulmonary exam normal breath sounds clear to auscultation       Cardiovascular hypertension, Pt. on medications + dysrhythmias Atrial Fibrillation  Rhythm:Irregular Rate:Normal     Neuro/Psych negative neurological ROS  negative psych ROS   GI/Hepatic Neg liver ROS, GERD  Medicated and Controlled,  Endo/Other  diabetesHypothyroidism Morbid obesity  Renal/GU negative Renal ROS  negative genitourinary   Musculoskeletal   Abdominal   Peds  Hematology negative hematology ROS (+)   Anesthesia Other Findings   Reproductive/Obstetrics negative OB ROS                            Anesthesia Physical Anesthesia Plan  ASA: III  Anesthesia Plan: General   Post-op Pain Management:    Induction: Intravenous  Airway Management Planned: Mask  Additional Equipment:   Intra-op Plan:   Post-operative Plan:   Informed Consent: I have reviewed the patients History and Physical, chart, labs and discussed the procedure including the risks, benefits and alternatives for the proposed anesthesia with the patient or authorized representative who has indicated his/her understanding and acceptance.   Dental advisory given  Plan Discussed with: CRNA  Anesthesia Plan Comments:         Anesthesia Quick Evaluation

## 2015-03-26 NOTE — Discharge Instructions (Signed)
Electrical Cardioversion, Care After °Refer to this sheet in the next few weeks. These instructions provide you with information on caring for yourself after your procedure. Your health care provider may also give you more specific instructions. Your treatment has been planned according to current medical practices, but problems sometimes occur. Call your health care provider if you have any problems or questions after your procedure. °WHAT TO EXPECT AFTER THE PROCEDURE °After your procedure, it is typical to have the following sensations: °· Some redness on the skin where the shocks were delivered. If this is tender, a sunburn lotion or hydrocortisone cream may help. °· Possible return of an abnormal heart rhythm within hours or days after the procedure. °HOME CARE INSTRUCTIONS °· Take medicines only as directed by your health care provider. Be sure you understand how and when to take your medicine. °· Learn how to feel your pulse and check it often. °· Limit your activity for 48 hours after the procedure or as directed by your health care provider. °· Avoid or minimize caffeine and other stimulants as directed by your health care provider. °SEEK MEDICAL CARE IF: °· You feel like your heart is beating too fast or your pulse is not regular. °· You have any questions about your medicines. °· You have bleeding that will not stop. °SEEK IMMEDIATE MEDICAL CARE IF: °· You are dizzy or feel faint. °· It is hard to breathe or you feel short of breath. °· There is a change in discomfort in your chest. °· Your speech is slurred or you have trouble moving an arm or leg on one side of your body. °· You get a serious muscle cramp that does not go away. °· Your fingers or toes turn cold or blue. °  °This information is not intended to replace advice given to you by your health care provider. Make sure you discuss any questions you have with your health care provider. °  °Document Released: 10/26/2012 Document Revised: 01/26/2014  Document Reviewed: 10/26/2012 °Elsevier Interactive Patient Education ©2016 [employer]vier Inc[employer] ° °

## 2015-03-26 NOTE — CV Procedure (Signed)
    Electrical Cardioversion Procedure Note Tammy Mckinney [id number] 10/22/1955  Procedure: Electrical Cardioversion Indications:  Atrial Fibrillation  Time Out: Verified patient identification, verified procedure,medications/allergies/relevent history reviewed, required imaging and test results available.  Performed  Procedure Details  The patient was NPO after midnight. Anesthesia was administered at the beside  by Dr. Soyla DryerEdmunds Fitzgerald with propofol.  Cardioversion was performed with synchronized biphasic defibrillation via AP pads with 120, 150 joules.  2 attempt(s) were performed.  The patient converted to normal sinus rhythm. The patient tolerated the procedure well   IMPRESSION:  Successful cardioversion of atrial fibrillation after 2 shocks. Continue amiodarone HOCM  Donato SchultzSKAINS, Daryl Quiros, MD     [name] 03/26/2015, 12:58 PM

## 2015-03-28 ENCOUNTER — Encounter (HOSPITAL_COMMUNITY): Payer: Self-pay | Admitting: Cardiology

## 2015-03-29 ENCOUNTER — Ambulatory Visit
Admission: RE | Admit: 2015-03-29 | Discharge: 2015-03-29 | Disposition: A | Discharge status: Home / Self Care | Payer: 59 | Source: Ambulatory Visit | Attending: Obstetrics and Gynecology | Admitting: Obstetrics and Gynecology

## 2015-03-29 ENCOUNTER — Other Ambulatory Visit: Payer: Self-pay | Admitting: Obstetrics and Gynecology

## 2015-03-29 DIAGNOSIS — R921 Mammographic calcification found on diagnostic imaging of breast: Secondary | ICD-10-CM

## 2015-03-29 DIAGNOSIS — R928 Other abnormal and inconclusive findings on diagnostic imaging of breast: Secondary | ICD-10-CM

## 2015-04-01 ENCOUNTER — Other Ambulatory Visit: Payer: Self-pay | Admitting: *Deleted

## 2015-04-01 DIAGNOSIS — G4733 Obstructive sleep apnea (adult) (pediatric): Secondary | ICD-10-CM

## 2015-04-02 ENCOUNTER — Ambulatory Visit (INDEPENDENT_AMBULATORY_CARE_PROVIDER_SITE_OTHER): Payer: 59

## 2015-04-02 DIAGNOSIS — I480 Paroxysmal atrial fibrillation: Secondary | ICD-10-CM

## 2015-04-02 DIAGNOSIS — Z5181 Encounter for therapeutic drug level monitoring: Secondary | ICD-10-CM

## 2015-04-02 DIAGNOSIS — I4891 Unspecified atrial fibrillation: Secondary | ICD-10-CM | POA: Diagnosis not present

## 2015-04-02 LAB — POCT INR: INR: 2.2

## 2015-04-05 ENCOUNTER — Ambulatory Visit
Admission: RE | Admit: 2015-04-05 | Discharge: 2015-04-05 | Disposition: A | Discharge status: Home / Self Care | Payer: 59 | Source: Ambulatory Visit | Attending: Obstetrics and Gynecology | Admitting: Obstetrics and Gynecology

## 2015-04-05 ENCOUNTER — Other Ambulatory Visit: Payer: Self-pay | Admitting: Obstetrics and Gynecology

## 2015-04-05 DIAGNOSIS — R921 Mammographic calcification found on diagnostic imaging of breast: Secondary | ICD-10-CM

## 2015-04-23 ENCOUNTER — Ambulatory Visit (INDEPENDENT_AMBULATORY_CARE_PROVIDER_SITE_OTHER): Payer: 59 | Admitting: *Deleted

## 2015-04-23 DIAGNOSIS — I480 Paroxysmal atrial fibrillation: Secondary | ICD-10-CM

## 2015-04-23 DIAGNOSIS — Z5181 Encounter for therapeutic drug level monitoring: Secondary | ICD-10-CM | POA: Diagnosis not present

## 2015-04-23 DIAGNOSIS — I4891 Unspecified atrial fibrillation: Secondary | ICD-10-CM | POA: Diagnosis not present

## 2015-04-23 LAB — POCT INR: INR: 3.2

## 2015-05-14 ENCOUNTER — Ambulatory Visit (INDEPENDENT_AMBULATORY_CARE_PROVIDER_SITE_OTHER): Payer: 59 | Admitting: *Deleted

## 2015-05-14 DIAGNOSIS — I4891 Unspecified atrial fibrillation: Secondary | ICD-10-CM | POA: Diagnosis not present

## 2015-05-14 DIAGNOSIS — I480 Paroxysmal atrial fibrillation: Secondary | ICD-10-CM | POA: Diagnosis not present

## 2015-05-14 DIAGNOSIS — Z5181 Encounter for therapeutic drug level monitoring: Secondary | ICD-10-CM

## 2015-05-14 LAB — POCT INR: INR: 2.5

## 2015-06-04 ENCOUNTER — Other Ambulatory Visit: Payer: Self-pay | Admitting: Cardiology

## 2015-06-12 ENCOUNTER — Ambulatory Visit (INDEPENDENT_AMBULATORY_CARE_PROVIDER_SITE_OTHER): Payer: 59 | Admitting: Cardiology

## 2015-06-12 ENCOUNTER — Encounter: Payer: Self-pay | Admitting: Cardiology

## 2015-06-12 ENCOUNTER — Ambulatory Visit (INDEPENDENT_AMBULATORY_CARE_PROVIDER_SITE_OTHER): Payer: 59 | Admitting: Pharmacist

## 2015-06-12 VITALS — BP 124/72 | HR 71 | Ht 66.0 in | Wt 252.0 lb

## 2015-06-12 DIAGNOSIS — I10 Essential (primary) hypertension: Secondary | ICD-10-CM | POA: Diagnosis not present

## 2015-06-12 DIAGNOSIS — E669 Obesity, unspecified: Secondary | ICD-10-CM | POA: Diagnosis not present

## 2015-06-12 DIAGNOSIS — I4891 Unspecified atrial fibrillation: Secondary | ICD-10-CM | POA: Diagnosis not present

## 2015-06-12 DIAGNOSIS — G4733 Obstructive sleep apnea (adult) (pediatric): Secondary | ICD-10-CM

## 2015-06-12 DIAGNOSIS — I480 Paroxysmal atrial fibrillation: Secondary | ICD-10-CM

## 2015-06-12 DIAGNOSIS — Z5181 Encounter for therapeutic drug level monitoring: Secondary | ICD-10-CM

## 2015-06-12 LAB — POCT INR: INR: 2.2

## 2015-06-12 NOTE — Patient Instructions (Signed)

## 2015-06-12 NOTE — Progress Notes (Signed)
Cardiology Office Note    Date:  06/12/2015   ID:  Tammy Mckinney, DOB Feb 02, 1955, MRN [id number]  PCP:  Tammy Patella, MD  Cardiologist:  Tammy Magic, MD   Chief Complaint  Patient presents with  . Sleep Apnea  . Hypertension    History of Present Illness:  Tammy Mckinney is a 60 y.o. female who presents for followup of OSA.   Past Medical History  Diagnosis Date  . Hypertension   . Dysrhythmia   . Sleep apnea     no cpap  . (HFpEF) heart failure with preserved ejection fraction (HCC)   . 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 Hypothyroidism   . Diabetes mellitus without complication (HCC)   . GERD (gastroesophageal reflux disease)     Past Surgical History  Procedure Laterality Date  . Abdominal hysterectomy    . Cholecystectomy    . Thyroidectomy    . Cardioversion N/A 06/09/2013    Procedure: CARDIOVERSION;  Surgeon: Tammy Schultz, MD;  Location: [facility] ENDOSCOPY;  Service: Cardiovascular;  Laterality: N/A;  . Cardioversion N/A 06/28/2014    Procedure: CARDIOVERSION;  Surgeon: Tammy Bathe, MD;  Location: [facility] ENDOSCOPY;  Service: Cardiovascular;  Laterality: N/A;  . Cardioversion N/A 03/26/2015    Procedure: CARDIOVERSION;  Surgeon: Tammy Bathe, MD;  Location: [facility] ENDOSCOPY;  Service: Cardiovascular;  Laterality: N/A;    Current Medications: Outpatient Prescriptions Prior to Visit  Medication Sig Dispense Refill  . acetaminophen (TYLENOL) 500 MG tablet Take 1,000 mg by mouth every 6 (six) hours as needed (pain).     Tammy Mckinney Kitchen amiodarone (PACERONE) 200 MG tablet TAKE 1 TABLET (200 MG TOTAL) BY MOUTH DAILY. 90 tablet 3  . atorvastatin (LIPITOR) 80 MG tablet Take 80 mg by mouth every evening.     . calcitRIOL (ROCALTROL) 0.25 MCG capsule Take 0.75 mcg by mouth every morning.     . fluticasone (FLONASE) 50 MCG/ACT nasal spray Place 1 spray into both nostrils 2 (two) times daily as needed for allergies.     Tammy Mckinney Kitchen levothyroxine (SYNTHROID, LEVOTHROID) 100 MCG tablet Take  100 mcg by mouth daily before breakfast.     . Multiple Vitamin (MULTIVITAMIN) tablet Take 1 tablet by mouth every evening.     . pantoprazole (PROTONIX) 40 MG tablet Take 40 mg by mouth daily as needed (reflux).     Tammy Mckinney Kitchen spironolactone (ALDACTONE) 25 MG tablet TAKE 1 TABLET (25 MG TOTAL) BY MOUTH DAILY. 30 tablet 5  . TANZEUM 50 MG PEN Inject 50 mg as directed once a week. Into the skin  5  . verapamil (CALAN-SR) 180 MG CR tablet TAKE 1 TABLET (180 MG TOTAL) BY MOUTH EVERY MORNING. 90 tablet 0  . warfarin (COUMADIN) 5 MG tablet TAKE AS DIRECTED BY ANTICOAGULATION CLINIC 35 tablet 3   No facility-administered medications prior to visit.     Allergies:   Oxycodone hcl   Social History   Social History  . Marital Status: Single    Spouse Name: N/A  . Number of Children: N/A  . Years of Education: N/A   Social History Main Topics  . Smoking status: Former Smoker    Quit date: 06/10/1998  . Smokeless tobacco: Never Used  . Alcohol Use: No  . Drug Use: No  . Sexual Activity: Not Asked   Other Topics Concern  . None   Social History Narrative     Family History:  The patient's family history includes Heart attack  in her father; Heart disease in her father; Hypertension in her father and mother; Stroke in her father.   ROS:   Please see the history of present illness.    ROS All other systems reviewed and are negative.   PHYSICAL EXAM:   VS:  BP 124/72 mmHg  Pulse 71  Ht 5\' 6"  (1.676 m)  Wt 252 lb (114.306 kg)  BMI 40.69 kg/m2   GEN: Well nourished, well developed, in no acute distress HEENT: normal Neck: no JVD, carotid bruits, or masses Cardiac: RRR; no murmurs, rubs, or gallops,no edema.  Intact distal pulses bilaterally.  Respiratory:  clear to auscultation bilaterally, normal work of breathing GI: soft, nontender, nondistended, + BS MS: no deformity or atrophy Skin: warm and dry, no rash Neuro:  Alert and Oriented x 3, Strength and sensation are intact Psych:  euthymic mood, full affect  Wt Readings from Last 3 Encounters:  06/12/15 252 lb (114.306 kg)  03/19/15 242 lb (109.77 kg)  02/27/15 242 lb 12.8 oz (110.133 kg)      Studies/Labs Reviewed:   EKG:  EKG is ordered today.  The ekg ordered today demonstrates NSR at 71bpm with LVH by repol and LAFB and anteroseptal infarct  Recent Labs: 03/19/2015: BUN 13; Creat 1.10*; Hemoglobin 13.6; Platelets 512*; Potassium 4.4; Sodium 139   Lipid Panel No results found for: CHOL, TRIG, HDL, CHOLHDL, VLDL, LDLCALC, LDLDIRECT  Additional studies/ records that were reviewed today include:  CPAP d/l    ASSESSMENT:    1. OSA (obstructive sleep apnea)   2. Obesity (BMI 30-39.9)   3. Benign essential HTN      PLAN:  In order of problems listed above:  OSA - the patient is tolerating PAP therapy well without any problems. The PAP download was reviewed today and showed an AHI of 10.6/hr on auto H2O with 30%% compliance in using more than 4 hours nightly.  Her 95th% pressure was 12.2/hr but max pressure was 13cm H2O.  She says that she wears it all night at least 5 hours despite machine saying differently.  She does not like the nasal pillow mask and so I will change her to a Resmed F20 airfit mask and get another 2 week autotitration from 4 to 18cm H2O.   HTN - BP controlled on current meds. Continue Verapamil and aldactone. Obesity - I have encouraged him to get into a routine exercise program and cut back on carbs and portions.     Medication Adjustments/Labs and Tests Ordered: Current medicines are reviewed at length with the patient today.  Concerns regarding medicines are outlined above.  Medication changes, Labs and Tests ordered today are listed in the Patient Instructions below.  There are no Patient Instructions on file for this visit.   Signed, Tammy Magicraci Tammy Merten, MD  06/12/2015 3:32 PM    [facility] [address], [place], [place]  [address] Phone: [phone](336) [phone];  Fax: [phone]

## 2015-06-19 ENCOUNTER — Encounter: Payer: Self-pay | Admitting: Cardiology

## 2015-07-02 ENCOUNTER — Other Ambulatory Visit: Payer: Self-pay | Admitting: Cardiology

## 2015-07-10 ENCOUNTER — Ambulatory Visit (INDEPENDENT_AMBULATORY_CARE_PROVIDER_SITE_OTHER): Payer: 59 | Admitting: *Deleted

## 2015-07-10 DIAGNOSIS — I4891 Unspecified atrial fibrillation: Secondary | ICD-10-CM

## 2015-07-10 DIAGNOSIS — Z5181 Encounter for therapeutic drug level monitoring: Secondary | ICD-10-CM

## 2015-07-10 DIAGNOSIS — I480 Paroxysmal atrial fibrillation: Secondary | ICD-10-CM

## 2015-07-10 LAB — POCT INR: INR: 2.7

## 2015-07-19 ENCOUNTER — Telehealth: Payer: Self-pay | Admitting: Cardiology

## 2015-07-19 NOTE — Telephone Encounter (Signed)
Left message for patient that the office CPAP assistant is not here today, but will call her Monday to follow-up.

## 2015-07-19 NOTE — Telephone Encounter (Signed)
New Message  Pt call requesting to speak with RN. Pt states she received a letter from the insurance company about her c pap machine. pt states the letter says they will pay for the machine next year.. Pt wants to know what she will have to do to get the machine back. Pt states she turned it in. Please call back to discuss

## 2015-07-22 NOTE — Telephone Encounter (Signed)
Left message for patient to call back  

## 2015-07-24 NOTE — Telephone Encounter (Signed)
Patient states that she originally got a letter that her machine was not going to be covered, so she turned her machine in.  Now, she says that she has a revised letter from the insurance company that they ARE going to cover the machine. She stated that she called [facility] back and was told that yall did not have a copy of the revised letter so nothing could be done. She said that she told them that she could bring it up there, and she was told no, that it would have to go through us.    I have sent a message to [employer] questioning what is needed and what they can do to help.  Patient is aware that I will call her as soon as I have more information.

## 2015-08-07 ENCOUNTER — Ambulatory Visit (INDEPENDENT_AMBULATORY_CARE_PROVIDER_SITE_OTHER): Payer: 59 | Admitting: Pharmacist

## 2015-08-07 DIAGNOSIS — I480 Paroxysmal atrial fibrillation: Secondary | ICD-10-CM | POA: Diagnosis not present

## 2015-08-07 DIAGNOSIS — I4891 Unspecified atrial fibrillation: Secondary | ICD-10-CM | POA: Diagnosis not present

## 2015-08-07 DIAGNOSIS — Z5181 Encounter for therapeutic drug level monitoring: Secondary | ICD-10-CM | POA: Diagnosis not present

## 2015-08-07 LAB — POCT INR: INR: 2.4

## 2015-09-18 ENCOUNTER — Ambulatory Visit (INDEPENDENT_AMBULATORY_CARE_PROVIDER_SITE_OTHER): Payer: 59 | Admitting: *Deleted

## 2015-09-18 DIAGNOSIS — I480 Paroxysmal atrial fibrillation: Secondary | ICD-10-CM | POA: Diagnosis not present

## 2015-09-18 DIAGNOSIS — Z5181 Encounter for therapeutic drug level monitoring: Secondary | ICD-10-CM

## 2015-09-18 DIAGNOSIS — I4891 Unspecified atrial fibrillation: Secondary | ICD-10-CM

## 2015-09-18 LAB — POCT INR: INR: 1.4

## 2015-09-27 ENCOUNTER — Ambulatory Visit (INDEPENDENT_AMBULATORY_CARE_PROVIDER_SITE_OTHER): Payer: 59 | Admitting: *Deleted

## 2015-09-27 DIAGNOSIS — I4891 Unspecified atrial fibrillation: Secondary | ICD-10-CM | POA: Diagnosis not present

## 2015-09-27 DIAGNOSIS — I480 Paroxysmal atrial fibrillation: Secondary | ICD-10-CM

## 2015-09-27 DIAGNOSIS — Z5181 Encounter for therapeutic drug level monitoring: Secondary | ICD-10-CM | POA: Diagnosis not present

## 2015-09-27 LAB — POCT INR: INR: 2.6

## 2015-10-08 ENCOUNTER — Encounter: Payer: Self-pay | Admitting: Cardiology

## 2015-10-21 ENCOUNTER — Encounter (INDEPENDENT_AMBULATORY_CARE_PROVIDER_SITE_OTHER): Payer: Self-pay

## 2015-10-21 ENCOUNTER — Ambulatory Visit (INDEPENDENT_AMBULATORY_CARE_PROVIDER_SITE_OTHER): Payer: 59 | Admitting: *Deleted

## 2015-10-21 ENCOUNTER — Ambulatory Visit (INDEPENDENT_AMBULATORY_CARE_PROVIDER_SITE_OTHER): Payer: 59 | Admitting: Cardiology

## 2015-10-21 VITALS — BP 120/82 | HR 90 | Ht 66.0 in | Wt 253.0 lb

## 2015-10-21 DIAGNOSIS — E669 Obesity, unspecified: Secondary | ICD-10-CM | POA: Diagnosis not present

## 2015-10-21 DIAGNOSIS — Z5181 Encounter for therapeutic drug level monitoring: Secondary | ICD-10-CM

## 2015-10-21 DIAGNOSIS — G4733 Obstructive sleep apnea (adult) (pediatric): Secondary | ICD-10-CM

## 2015-10-21 DIAGNOSIS — I4891 Unspecified atrial fibrillation: Secondary | ICD-10-CM

## 2015-10-21 DIAGNOSIS — I10 Essential (primary) hypertension: Secondary | ICD-10-CM | POA: Diagnosis not present

## 2015-10-21 LAB — POCT INR: INR: 1.3

## 2015-10-21 NOTE — Patient Instructions (Addendum)
Medication Instructions:  Your physician recommends that you continue on your current medications as directed. Please refer to the Current Medication list given to you today.   Labwork: None  Testing/Procedures: None  Follow-Up: Your physician recommends that you schedule a follow-up appointment in 3 MONTHS with Dr. Mayford Knifeurner.  Any Other Special Instructions Will Be Listed Below (If Applicable). A new CPAP has been ordered for you. Please call Toma CopierBethany, CMA (our office CPAP assistant) directly at [phone] if you have any questions or concerns.     If you need a refill on your cardiac medications before your next appointment, please call your pharmacy.

## 2015-10-21 NOTE — Progress Notes (Signed)
Cardiology Office Note    Date:  10/21/2015   ID:  Tammy Mckinney, DOB 07/21/1955, MRN [id number]  PCP:  Lolita PatellaEADE,ROBERT ALEXANDER, MD  Cardiologist:  Armanda Magicraci Turner, MD   Chief Complaint  Patient presents with  . Sleep Apnea  . Hypertension    History of Present Illness:  Tammy Mckinney is a 60 y.o. female with a history of OSA, CHF and HTN who presents today for followup of her OSA.  She apparently was not being compliant with her CPAP and insurance made her turn it in.  She did not tolerate the nasal pillow mask because the mask kept coming off.  She would like to get a new machine to get back on her CPAP.  She is not sleeping well at night and feeling fatigued during the day.  She gets SOB at night at times.    Past Medical History:  Diagnosis Date  . (HFpEF) heart failure with preserved ejection fraction (HCC)   . Diabetes mellitus without complication (HCC)   . Dysrhythmia   . GERD (gastroesophageal reflux disease)   . Hypertension   . Hypothyroidism   . 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 Sleep apnea    no cpap    Past Surgical History:  Procedure Laterality Date  . ABDOMINAL HYSTERECTOMY    . CARDIOVERSION N/A 06/09/2013   Procedure: CARDIOVERSION;  Surgeon: Donato SchultzMark Skains, MD;  [facility];  Service: Cardiovascular;  Laterality: N/A;  . CARDIOVERSION N/A 06/28/2014   Procedure: CARDIOVERSION;  Surgeon: Jake BatheMark C Skains, MD;  [facility];  Service: Cardiovascular;  Laterality: N/A;  . CARDIOVERSION N/A 03/26/2015   Procedure: CARDIOVERSION;  Surgeon: Jake BatheMark C Skains, MD;  [facility];  Service: Cardiovascular;  Laterality: N/A;  . CHOLECYSTECTOMY    . THYROIDECTOMY      Current Medications: Outpatient Medications Prior to Visit  Medication Sig Dispense Refill  . acetaminophen (TYLENOL) 500 MG tablet Take 1,000 mg by mouth every 6 (six) hours as needed (pain).     Marland Kitchen. amiodarone (PACERONE) 200 MG tablet TAKE 1 TABLET (200 MG TOTAL) BY MOUTH  DAILY. 90 tablet 3  . atorvastatin (LIPITOR) 80 MG tablet Take 80 mg by mouth every evening.     . calcitRIOL (ROCALTROL) 0.25 MCG capsule Take 0.75 mcg by mouth every morning.     . fluticasone (FLONASE) 50 MCG/ACT nasal spray Place 1 spray into both nostrils 2 (two) times daily as needed for allergies.     Marland Kitchen. levothyroxine (SYNTHROID, LEVOTHROID) 100 MCG tablet Take 100 mcg by mouth daily before breakfast.     . Multiple Vitamin (MULTIVITAMIN) tablet Take 1 tablet by mouth every evening.     . pantoprazole (PROTONIX) 40 MG tablet Take 40 mg by mouth daily as needed (reflux).     Marland Kitchen. spironolactone (ALDACTONE) 25 MG tablet TAKE 1 TABLET (25 MG TOTAL) BY MOUTH DAILY. 30 tablet 5  . TANZEUM 50 MG PEN Inject 50 mg as directed once a week. Into the skin  5  . verapamil (CALAN-SR) 180 MG CR tablet TAKE 1 TABLET (180 MG TOTAL) BY MOUTH EVERY MORNING. 90 tablet 0  . warfarin (COUMADIN) 5 MG tablet TAKE AS DIRECTED BY ANTICOAGULATION CLINIC 35 tablet 3  . meclizine (ANTIVERT) 12.5 MG tablet Take 1-2 tablets by mouth every 6 (six) hours as needed. (dizziness)  1   No facility-administered medications prior to visit.      Allergies:   Oxycodone hcl  Social History   Social History  . Marital status: Single    Spouse name: N/A  . Number of children: N/A  . Years of education: N/A   Social History Main Topics  . Smoking status: Former Smoker    Quit date: 06/10/1998  . Smokeless tobacco: Never Used  . Alcohol use No  . Drug use: No  . Sexual activity: Not on file   Other Topics Concern  . Not on file   Social History Narrative  . No narrative on file     Family History:  The patient's family history includes Heart attack in her father; Heart disease in her father; Hypertension in her father and mother; Stroke in her father.   ROS:   Please see the history of present illness.    ROS All other systems reviewed and are negative.  No flowsheet data found.     PHYSICAL EXAM:   VS:   BP 120/82   Pulse 90   Ht 5\' 6"  (1.676 m)   Wt 253 lb (114.8 kg)   BMI 40.84 kg/m    GEN: Well nourished, well developed, in no acute distress  HEENT: normal  Neck: no JVD, carotid bruits, or masses Cardiac: RRR; no murmurs, rubs, or gallops,no edema.  Intact distal pulses bilaterally.  Respiratory:  clear to auscultation bilaterally, normal work of breathing GI: soft, nontender, nondistended, + BS MS: no deformity or atrophy  Skin: warm and dry, no rash Neuro:  Alert and Oriented x 3, Strength and sensation are intact Psych: euthymic mood, full affect  Wt Readings from Last 3 Encounters:  10/21/15 253 lb (114.8 kg)  06/12/15 252 lb (114.3 kg)  03/19/15 242 lb (109.8 kg)      Studies/Labs Reviewed:   EKG:  EKG is not ordered today.    Recent Labs: 03/19/2015: BUN 13; Creat 1.10; Hemoglobin 13.6; Platelets 512; Potassium 4.4; Sodium 139   Lipid Panel No results found for: CHOL, TRIG, HDL, CHOLHDL, VLDL, LDLCALC, LDLDIRECT  Additional studies/ records that were reviewed today include:  CPAP download    ASSESSMENT:    1. OSA (obstructive sleep apnea)   2. Benign essential HTN   3. Obesity (BMI 30-39.9)      PLAN:  In order of problems listed above:  1.  OSA - I will order her a ResMed Full face mask and machine.  I have stressed the importance of being compliant.  I will check with her DME to see if she needs a new sleep study.   2.  HTN - BP controlled on current meds.  Continue CCB and diuretic. 3.  Obesity - I have encouraged him to get into a routine exercise program and cut back on carbs and portions.     Medication Adjustments/Labs and Tests Ordered: Current medicines are reviewed at length with the patient today.  Concerns regarding medicines are outlined above.  Medication changes, Labs and Tests ordered today are listed in the Patient Instructions below.  There are no Patient Instructions on file for this visit.   Signed, Armanda Magic, MD    10/21/2015 8:45 AM    [facility] [address] [place], [place], [place]  [address] Phone: [phone]; Fax: [phone]

## 2015-10-30 ENCOUNTER — Ambulatory Visit (INDEPENDENT_AMBULATORY_CARE_PROVIDER_SITE_OTHER): Payer: 59 | Admitting: *Deleted

## 2015-10-30 DIAGNOSIS — I4891 Unspecified atrial fibrillation: Secondary | ICD-10-CM | POA: Diagnosis not present

## 2015-10-30 DIAGNOSIS — Z5181 Encounter for therapeutic drug level monitoring: Secondary | ICD-10-CM

## 2015-10-30 LAB — POCT INR: INR: 2.6

## 2015-11-14 ENCOUNTER — Telehealth: Payer: Self-pay | Admitting: *Deleted

## 2015-11-14 NOTE — Telephone Encounter (Signed)
Per [facility], due to the break in using CPAP, insurance will not pay for a new machine until a sleep study is done.  The suggestion of [facility] is for patient to do a Home Sleep Study then an auto-titration with [facility] to get set back up. Patient is aware of this and she is okay to proceed with home sleep study.  I will get her set up with [facility] for testing.

## 2015-11-19 ENCOUNTER — Ambulatory Visit (INDEPENDENT_AMBULATORY_CARE_PROVIDER_SITE_OTHER): Payer: 59 | Admitting: *Deleted

## 2015-11-19 DIAGNOSIS — I4891 Unspecified atrial fibrillation: Secondary | ICD-10-CM

## 2015-11-19 DIAGNOSIS — Z5181 Encounter for therapeutic drug level monitoring: Secondary | ICD-10-CM

## 2015-11-19 LAB — POCT INR: INR: 3.8

## 2015-11-22 ENCOUNTER — Encounter: Payer: Self-pay | Admitting: Cardiology

## 2015-11-29 ENCOUNTER — Other Ambulatory Visit: Payer: Self-pay | Admitting: Obstetrics and Gynecology

## 2015-11-29 DIAGNOSIS — R921 Mammographic calcification found on diagnostic imaging of breast: Secondary | ICD-10-CM

## 2015-12-03 ENCOUNTER — Other Ambulatory Visit: Payer: Self-pay

## 2015-12-03 MED ORDER — WARFARIN SODIUM 5 MG PO TABS
ORAL_TABLET | ORAL | 3 refills | Status: DC
Start: 2015-12-03 — End: 2015-12-04

## 2015-12-04 ENCOUNTER — Other Ambulatory Visit: Payer: Self-pay | Admitting: *Deleted

## 2015-12-04 MED ORDER — WARFARIN SODIUM 5 MG PO TABS: ORAL_TABLET | ORAL | 3 refills | Status: AC

## 2015-12-09 ENCOUNTER — Ambulatory Visit (INDEPENDENT_AMBULATORY_CARE_PROVIDER_SITE_OTHER): Payer: 59 | Admitting: *Deleted

## 2015-12-09 ENCOUNTER — Encounter: Payer: Self-pay | Admitting: Cardiology

## 2015-12-09 ENCOUNTER — Ambulatory Visit (INDEPENDENT_AMBULATORY_CARE_PROVIDER_SITE_OTHER): Payer: 59 | Admitting: Cardiology

## 2015-12-09 VITALS — BP 122/78 | HR 70 | Ht 67.0 in | Wt 253.8 lb

## 2015-12-09 DIAGNOSIS — I421 Obstructive hypertrophic cardiomyopathy: Secondary | ICD-10-CM

## 2015-12-09 DIAGNOSIS — I480 Paroxysmal atrial fibrillation: Secondary | ICD-10-CM | POA: Diagnosis not present

## 2015-12-09 DIAGNOSIS — Z5181 Encounter for therapeutic drug level monitoring: Secondary | ICD-10-CM | POA: Diagnosis not present

## 2015-12-09 DIAGNOSIS — I4891 Unspecified atrial fibrillation: Secondary | ICD-10-CM

## 2015-12-09 LAB — ALT: ALT: 21 U/L (ref 6–29)

## 2015-12-09 LAB — POCT INR: INR: 3

## 2015-12-09 NOTE — Patient Instructions (Addendum)
Medication Instructions:  The current medical regimen is effective;  continue present plan and medications.  Please have blood work today. (ALT)  Follow-Up: Follow up in 6 months with Dr. Anne FuSkains.  You will receive a letter in the mail 2 months before you are due.  Please call us when you receive this letter to schedule your follow up appointment.  If you need a refill on your cardiac medications before your next appointment, please call your pharmacy.  Thank you for choosing  [facility]!!

## 2015-12-09 NOTE — Progress Notes (Signed)
[address] [place], [place]  [address] Phone: [phone] Fax:  [phone]  Date:  12/09/2015   ID:  Tammy Mckinney, DOB 02-13-1955, MRN [id number]  PCP:  Lolita Patella, MD   History of Present Illness: Tammy Mckinney is a 60 y.o. 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  She had cardioversion in June 2016. Amiodarone 200 mg. Doing well currently. She feels better in sinus rhythm.  Previous cardioversion 5/15 and 6/16.   She has no early family history of sudden cardiac death, no syncope, no palpitations, no angina. She also has OSA. and was not utilizing her CPAP machine.  Needs new equipment.   Today while coming in the office she did feel some palpitations, skipping of her heartbeat. No chest pain, no syncope, no significant shortness of breath. No cough, no fevers.  At work people with fever. [employer], EKG on 01/24/15 confirms atrial fibrillation. She is not short of breath. No chest pain.  02/27/15 When tear nose and throat because of gravelly voice, scope performed, vocal cord paralysis. PPI started. Physical therapy. She has lost approximate 10 pounds. Slightly less short of breath however she still remains in atrial fibrillation on exam. No bleeding. Went over echocardiogram with normal ejection fraction, severely dilated left atrium. No chest pain.  12/09/15-she is now maintaining sinus rhythm after cardioversion with amiodarone. Feeling better but still some shortness of breath with activity. She had a night exam, she has had cataracts removed and her eye physician took a picture of some of the plaque deposition from the amiodarone so I could see this. We discussed at length.   Wt Readings from Last 3 Encounters:  12/09/15 253 lb 12.8 oz (115.1 kg)  10/21/15 253 lb (114.8 kg)  06/12/15 252 lb (114.3  kg)     Past Medical History:  Diagnosis Date  . (HFpEF) heart failure with preserved ejection fraction (HCC)   . Diabetes mellitus without complication (HCC)   . Dysrhythmia   . GERD (gastroesophageal reflux disease)   . Hypertension   . Hypothyroidism   . 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 Sleep apnea    no cpap    Past Surgical History:  Procedure Laterality Date  . ABDOMINAL HYSTERECTOMY    . CARDIOVERSION N/A 06/09/2013   Procedure: CARDIOVERSION;  Surgeon: [name], MD;  Location: [facility] ENDOSCOPY;  Service: Cardiovascular;  Laterality: N/A;  . CARDIOVERSION N/A 06/28/2014   Procedure: CARDIOVERSION;  Surgeon: Jake Bathe, MD;  Location: [facility] ENDOSCOPY;  Service: Cardiovascular;  Laterality: N/A;  . CARDIOVERSION N/A 03/26/2015   Procedure: CARDIOVERSION;  Surgeon: Jake Bathe, MD;  Location: [facility] ENDOSCOPY;  Service: Cardiovascular;  Laterality: N/A;  . CHOLECYSTECTOMY    . THYROIDECTOMY      Current Outpatient Prescriptions  Medication Sig Dispense Refill  . acetaminophen (TYLENOL) 500 MG tablet Take 1,000 mg by mouth every 6 (six) hours as needed (pain).     Marland Kitchen amiodarone (PACERONE) 200 MG tablet TAKE 1 TABLET (200 MG TOTAL) BY MOUTH DAILY. 90 tablet 3  . atorvastatin (LIPITOR) 80 MG tablet Take 80 mg by mouth every evening.     . calcitRIOL (ROCALTROL) 0.25 MCG capsule Take 0.75 mcg by mouth every morning.     . fluticasone (FLONASE) 50 MCG/ACT nasal spray Place 1 spray into both nostrils 2 (two)  times daily as needed for allergies.     Marland Kitchen. levothyroxine (SYNTHROID, LEVOTHROID) 100 MCG tablet Take 100 mcg by mouth daily before breakfast.     . Multiple Vitamin (MULTIVITAMIN) tablet Take 1 tablet by mouth every evening.     . pantoprazole (PROTONIX) 40 MG tablet Take 40 mg by mouth daily as needed (reflux).     Marland Kitchen. spironolactone (ALDACTONE) 25 MG tablet TAKE 1 TABLET (25 MG TOTAL) BY MOUTH DAILY. 30 tablet 5  . TANZEUM 50 MG PEN Inject 50 mg as directed once a week. Into  the skin  5  . verapamil (CALAN-SR) 180 MG CR tablet TAKE 1 TABLET (180 MG TOTAL) BY MOUTH EVERY MORNING. 90 tablet 0  . warfarin (COUMADIN) 5 MG tablet TAKE AS DIRECTED BY ANTICOAGULATION CLINIC 35 tablet 3   No current facility-administered medications for this visit.     Allergies:    Allergies  Allergen Reactions  . Oxycodone Hcl Itching    oxycontin    Social History:  The patient  reports that she quit smoking about 17 years ago. She has never used smokeless tobacco. She reports that she does not drink alcohol or use drugs.   Family History  Problem Relation Age of Onset  . Hypertension Mother   . Heart attack Father   . Heart disease Father   . Hypertension Father   . Stroke Father     ROS:  Please see the history of present illness.   Denies any bleeding, syncope, orthopnea, PND   All other systems reviewed and negative. 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  PHYSICAL EXAM: VS:  BP 122/78   Pulse 70   Ht 5\' 7"  (1.702 m)   Wt 253 lb 12.8 oz (115.1 kg)   LMP  (LMP Unknown)   BMI 39.75 kg/m  Well nourished, well developed, in no acute distress HEENT: normal, [place]/AT, EOMI Neck: no JVD, normal carotid upstroke, no bruit Cardiac:  Normal rate and rhythm; soft outflow tract murmur Lungs:  clear to auscultation bilaterally, no wheezing, rhonchi or rales Abd: soft, nontender, no hepatomegaly, no bruitsoverweight Ext: no edema, 2+ distal pulses Skin: warm and dry GU: deferred Neuro: no focal abnormalities noted, AAO x 3  EKG:  01/24/15- atrial fibrillation 75, left bundle branch block with PVCs. 07/17/14-sinus bradycardia with first-degree AV block 270 ms with PACs, nonspecific intraventricular block. Previous 06/22/14 05/09/14 - AFIB/Flutter 56. 06/01/13-atrial fibrillation heart rate 63, LDH, left axis deviation     ECHO: 08/11/10 -  1. Severe asymmetric septal hypertrophy. HOCM. 2. Left ventricular ejection fraction estimated by 2D at 50-55 percent. 3. There were no regional  wall motion abnormalities. 4. Moderate left atrial enlargement. 5. Trace mitral valve regurgitation. 6. Trivial tricuspid regurgitation. 7. Mild calcification of the aortic valve. 8. Trace of pulmonic valve regurgitation. 9. Analysis of mitral valve inflow, pulmonary vein Doppler and tissue Doppler suggests grade I diastolic dysfunction without elevated left atrial pressure.s  Holter 7/12 - occasional PVCs, PACs, no atrial fibrillation, no ventricular tachycardia ECG: 12/09/15 today, sinus rhythm, first-degree AV block 254 ms with nonspecific intraventricular conduction delay.  ASSESSMENT AND PLAN:  1. Atrial fibrillation parox-hypertrophic cardiomyopathy. Feels less fatigue. Less SOB after cardioversion on 06/28/14. Continue with verapamil. Continue with amiodarone 20 mg once a day. She has had some deposition noted on retina, eye exam. She showed me a picture. We have discussed the risks versus benefits of amiodarone and she wishes to proceed at this point. We will  check ALT today. Other lab work recently done by Dr. Nicholos Johnseade TSH, CBC, creatinine on normal. 2. First-degree AV block.-No significant change with administration of amiodarone. 3. Hypertrophic cardiomyopathy-severe asymmetric septal hypertrophy, ejection fraction 55%. Trying to maintain sinus rhythm. This may be a challenge with her severely dilated left atrium. She feels better in normal rhythm. She still has some fatigue, not perfect. 4. Obesity-encourage weight loss. Good job 10 pounds 5. Obstructive sleep apnea-Dr. Mayford Knifeurner has seen, dental appliance 6. Chronic anticoagulation- Please see notes 7. We will see back 6 months  Signed, Donato SchultzMark Skains, MD [facility]  12/09/2015 4:43 PM

## 2015-12-17 ENCOUNTER — Ambulatory Visit
Admission: RE | Admit: 2015-12-17 | Discharge: 2015-12-17 | Disposition: A | Discharge status: Home / Self Care | Payer: 59 | Source: Ambulatory Visit | Attending: Obstetrics and Gynecology | Admitting: Obstetrics and Gynecology

## 2015-12-17 DIAGNOSIS — R921 Mammographic calcification found on diagnostic imaging of breast: Secondary | ICD-10-CM

## 2015-12-25 ENCOUNTER — Ambulatory Visit: Payer: Self-pay | Admitting: Cardiology

## 2015-12-25 DIAGNOSIS — I480 Paroxysmal atrial fibrillation: Secondary | ICD-10-CM

## 2015-12-25 DIAGNOSIS — Z5181 Encounter for therapeutic drug level monitoring: Secondary | ICD-10-CM

## 2016-01-20 DEATH — deceased
